# Patient Record
Sex: Female | Born: 1945 | Race: White | Hispanic: No | Smoking: Former smoker
Health system: Southern US, Community
[De-identification: ages and names within clinical notes are randomized; demographics above are authoritative.]

## PROBLEM LIST (undated history)

## (undated) DIAGNOSIS — I10 Essential (primary) hypertension: Secondary | ICD-10-CM

## (undated) DIAGNOSIS — M8580 Other specified disorders of bone density and structure, unspecified site: Secondary | ICD-10-CM

## (undated) DIAGNOSIS — C50919 Malignant neoplasm of unspecified site of unspecified female breast: Secondary | ICD-10-CM

## (undated) DIAGNOSIS — C4490 Unspecified malignant neoplasm of skin, unspecified: Secondary | ICD-10-CM

## (undated) DIAGNOSIS — E785 Hyperlipidemia, unspecified: Secondary | ICD-10-CM

## (undated) DIAGNOSIS — E559 Vitamin D deficiency, unspecified: Secondary | ICD-10-CM

## (undated) DIAGNOSIS — M1990 Unspecified osteoarthritis, unspecified site: Secondary | ICD-10-CM

## (undated) DIAGNOSIS — T7840XA Allergy, unspecified, initial encounter: Secondary | ICD-10-CM

## (undated) HISTORY — DX: Other specified disorders of bone density and structure, unspecified site: M85.80

## (undated) HISTORY — DX: Essential (primary) hypertension: I10

## (undated) HISTORY — DX: Allergy, unspecified, initial encounter: T78.40XA

## (undated) HISTORY — PX: APPENDECTOMY: SHX54

## (undated) HISTORY — DX: Hyperlipidemia, unspecified: E78.5

## (undated) HISTORY — DX: Unspecified osteoarthritis, unspecified site: M19.90

## (undated) HISTORY — DX: Malignant neoplasm of unspecified site of unspecified female breast: C50.919

## (undated) HISTORY — DX: Vitamin D deficiency, unspecified: E55.9

## (undated) HISTORY — DX: Unspecified malignant neoplasm of skin, unspecified: C44.90

---
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
## 2006-07-31 DIAGNOSIS — C4372 Malignant melanoma of left lower limb, including hip: Secondary | ICD-10-CM

## 2006-07-31 HISTORY — PX: MELANOMA EXCISION WITH SENTINEL LYMPH NODE BIOPSY: SHX5267

## 2006-07-31 HISTORY — DX: Malignant melanoma of left lower limb, including hip: C43.72

## 2006-10-12 DIAGNOSIS — C439 Malignant melanoma of skin, unspecified: Secondary | ICD-10-CM | POA: Insufficient documentation

## 2012-07-31 HISTORY — PX: BASAL CELL CARCINOMA EXCISION: SHX1214

## 2012-08-12 DIAGNOSIS — Z1211 Encounter for screening for malignant neoplasm of colon: Secondary | ICD-10-CM | POA: Diagnosis not present

## 2012-08-27 DIAGNOSIS — K219 Gastro-esophageal reflux disease without esophagitis: Secondary | ICD-10-CM | POA: Diagnosis not present

## 2012-08-27 DIAGNOSIS — Z1211 Encounter for screening for malignant neoplasm of colon: Secondary | ICD-10-CM | POA: Diagnosis not present

## 2012-08-27 DIAGNOSIS — Z7982 Long term (current) use of aspirin: Secondary | ICD-10-CM | POA: Diagnosis not present

## 2012-08-27 DIAGNOSIS — I10 Essential (primary) hypertension: Secondary | ICD-10-CM | POA: Diagnosis not present

## 2012-08-27 DIAGNOSIS — Z79899 Other long term (current) drug therapy: Secondary | ICD-10-CM | POA: Diagnosis not present

## 2012-08-27 DIAGNOSIS — E7800 Pure hypercholesterolemia, unspecified: Secondary | ICD-10-CM | POA: Diagnosis not present

## 2012-08-27 DIAGNOSIS — K644 Residual hemorrhoidal skin tags: Secondary | ICD-10-CM | POA: Diagnosis not present

## 2012-08-27 DIAGNOSIS — Z85820 Personal history of malignant melanoma of skin: Secondary | ICD-10-CM | POA: Diagnosis not present

## 2012-10-24 DIAGNOSIS — D485 Neoplasm of uncertain behavior of skin: Secondary | ICD-10-CM | POA: Diagnosis not present

## 2012-10-24 DIAGNOSIS — L570 Actinic keratosis: Secondary | ICD-10-CM | POA: Diagnosis not present

## 2012-10-29 DIAGNOSIS — C4441 Basal cell carcinoma of skin of scalp and neck: Secondary | ICD-10-CM | POA: Diagnosis not present

## 2012-12-16 DIAGNOSIS — Z1239 Encounter for other screening for malignant neoplasm of breast: Secondary | ICD-10-CM | POA: Diagnosis not present

## 2012-12-16 DIAGNOSIS — Z01419 Encounter for gynecological examination (general) (routine) without abnormal findings: Secondary | ICD-10-CM | POA: Diagnosis not present

## 2012-12-20 DIAGNOSIS — Z1231 Encounter for screening mammogram for malignant neoplasm of breast: Secondary | ICD-10-CM | POA: Diagnosis not present

## 2013-01-02 DIAGNOSIS — N6009 Solitary cyst of unspecified breast: Secondary | ICD-10-CM | POA: Diagnosis not present

## 2013-01-02 DIAGNOSIS — R928 Other abnormal and inconclusive findings on diagnostic imaging of breast: Secondary | ICD-10-CM | POA: Diagnosis not present

## 2013-05-06 DIAGNOSIS — L219 Seborrheic dermatitis, unspecified: Secondary | ICD-10-CM | POA: Diagnosis not present

## 2013-05-06 DIAGNOSIS — L821 Other seborrheic keratosis: Secondary | ICD-10-CM | POA: Diagnosis not present

## 2013-05-06 DIAGNOSIS — D2360 Other benign neoplasm of skin of unspecified upper limb, including shoulder: Secondary | ICD-10-CM | POA: Diagnosis not present

## 2014-01-21 DIAGNOSIS — E559 Vitamin D deficiency, unspecified: Secondary | ICD-10-CM | POA: Diagnosis not present

## 2014-01-21 DIAGNOSIS — Z9181 History of falling: Secondary | ICD-10-CM | POA: Diagnosis not present

## 2014-01-21 DIAGNOSIS — E782 Mixed hyperlipidemia: Secondary | ICD-10-CM | POA: Diagnosis not present

## 2014-01-21 DIAGNOSIS — Z1331 Encounter for screening for depression: Secondary | ICD-10-CM | POA: Diagnosis not present

## 2014-01-21 DIAGNOSIS — I10 Essential (primary) hypertension: Secondary | ICD-10-CM | POA: Diagnosis not present

## 2014-01-21 DIAGNOSIS — M899 Disorder of bone, unspecified: Secondary | ICD-10-CM | POA: Diagnosis not present

## 2014-01-21 DIAGNOSIS — M949 Disorder of cartilage, unspecified: Secondary | ICD-10-CM | POA: Diagnosis not present

## 2014-02-24 DIAGNOSIS — Z01419 Encounter for gynecological examination (general) (routine) without abnormal findings: Secondary | ICD-10-CM | POA: Diagnosis not present

## 2014-02-24 DIAGNOSIS — Z1239 Encounter for other screening for malignant neoplasm of breast: Secondary | ICD-10-CM | POA: Diagnosis not present

## 2014-03-12 DIAGNOSIS — Z1231 Encounter for screening mammogram for malignant neoplasm of breast: Secondary | ICD-10-CM | POA: Diagnosis not present

## 2014-06-10 DIAGNOSIS — M6740 Ganglion, unspecified site: Secondary | ICD-10-CM | POA: Diagnosis not present

## 2014-06-10 DIAGNOSIS — M25572 Pain in left ankle and joints of left foot: Secondary | ICD-10-CM | POA: Diagnosis not present

## 2014-06-10 DIAGNOSIS — M25531 Pain in right wrist: Secondary | ICD-10-CM | POA: Diagnosis not present

## 2014-06-10 DIAGNOSIS — M19231 Secondary osteoarthritis, right wrist: Secondary | ICD-10-CM | POA: Diagnosis not present

## 2014-06-10 DIAGNOSIS — M19041 Primary osteoarthritis, right hand: Secondary | ICD-10-CM | POA: Diagnosis not present

## 2014-06-22 DIAGNOSIS — M67431 Ganglion, right wrist: Secondary | ICD-10-CM | POA: Diagnosis not present

## 2014-09-29 DIAGNOSIS — L570 Actinic keratosis: Secondary | ICD-10-CM | POA: Diagnosis not present

## 2014-09-29 DIAGNOSIS — L219 Seborrheic dermatitis, unspecified: Secondary | ICD-10-CM | POA: Diagnosis not present

## 2014-09-29 DIAGNOSIS — D485 Neoplasm of uncertain behavior of skin: Secondary | ICD-10-CM | POA: Diagnosis not present

## 2014-09-29 DIAGNOSIS — C44311 Basal cell carcinoma of skin of nose: Secondary | ICD-10-CM | POA: Diagnosis not present

## 2014-09-29 DIAGNOSIS — L299 Pruritus, unspecified: Secondary | ICD-10-CM | POA: Diagnosis not present

## 2014-10-21 DIAGNOSIS — C44311 Basal cell carcinoma of skin of nose: Secondary | ICD-10-CM | POA: Diagnosis not present

## 2015-02-09 DIAGNOSIS — L728 Other follicular cysts of the skin and subcutaneous tissue: Secondary | ICD-10-CM | POA: Diagnosis not present

## 2015-02-09 DIAGNOSIS — L821 Other seborrheic keratosis: Secondary | ICD-10-CM | POA: Diagnosis not present

## 2015-03-31 DIAGNOSIS — Z01419 Encounter for gynecological examination (general) (routine) without abnormal findings: Secondary | ICD-10-CM | POA: Diagnosis not present

## 2015-04-07 DIAGNOSIS — Z1231 Encounter for screening mammogram for malignant neoplasm of breast: Secondary | ICD-10-CM | POA: Diagnosis not present

## 2015-07-13 DIAGNOSIS — L820 Inflamed seborrheic keratosis: Secondary | ICD-10-CM | POA: Diagnosis not present

## 2016-01-11 DIAGNOSIS — L821 Other seborrheic keratosis: Secondary | ICD-10-CM | POA: Diagnosis not present

## 2016-01-11 DIAGNOSIS — D225 Melanocytic nevi of trunk: Secondary | ICD-10-CM | POA: Diagnosis not present

## 2016-07-10 DIAGNOSIS — Z800 Family history of malignant neoplasm of digestive organs: Secondary | ICD-10-CM | POA: Diagnosis not present

## 2016-07-10 DIAGNOSIS — M8580 Other specified disorders of bone density and structure, unspecified site: Secondary | ICD-10-CM | POA: Diagnosis not present

## 2016-07-10 DIAGNOSIS — E559 Vitamin D deficiency, unspecified: Secondary | ICD-10-CM | POA: Diagnosis not present

## 2016-07-10 DIAGNOSIS — I10 Essential (primary) hypertension: Secondary | ICD-10-CM | POA: Diagnosis not present

## 2016-07-10 DIAGNOSIS — Z79899 Other long term (current) drug therapy: Secondary | ICD-10-CM | POA: Diagnosis not present

## 2016-07-10 DIAGNOSIS — E2839 Other primary ovarian failure: Secondary | ICD-10-CM | POA: Diagnosis not present

## 2016-07-10 DIAGNOSIS — Z23 Encounter for immunization: Secondary | ICD-10-CM | POA: Diagnosis not present

## 2016-07-10 DIAGNOSIS — Z1231 Encounter for screening mammogram for malignant neoplasm of breast: Secondary | ICD-10-CM | POA: Diagnosis not present

## 2016-07-10 DIAGNOSIS — E782 Mixed hyperlipidemia: Secondary | ICD-10-CM | POA: Diagnosis not present

## 2016-07-10 DIAGNOSIS — Z1389 Encounter for screening for other disorder: Secondary | ICD-10-CM | POA: Diagnosis not present

## 2016-07-10 DIAGNOSIS — Z9181 History of falling: Secondary | ICD-10-CM | POA: Diagnosis not present

## 2016-07-10 DIAGNOSIS — Z6828 Body mass index (BMI) 28.0-28.9, adult: Secondary | ICD-10-CM | POA: Diagnosis not present

## 2016-07-13 DIAGNOSIS — L570 Actinic keratosis: Secondary | ICD-10-CM | POA: Diagnosis not present

## 2016-07-27 DIAGNOSIS — Z1231 Encounter for screening mammogram for malignant neoplasm of breast: Secondary | ICD-10-CM | POA: Diagnosis not present

## 2016-07-27 DIAGNOSIS — M8580 Other specified disorders of bone density and structure, unspecified site: Secondary | ICD-10-CM | POA: Insufficient documentation

## 2016-07-27 DIAGNOSIS — M8589 Other specified disorders of bone density and structure, multiple sites: Secondary | ICD-10-CM | POA: Diagnosis not present

## 2016-07-27 DIAGNOSIS — Z780 Asymptomatic menopausal state: Secondary | ICD-10-CM | POA: Insufficient documentation

## 2016-07-27 DIAGNOSIS — M8588 Other specified disorders of bone density and structure, other site: Secondary | ICD-10-CM | POA: Diagnosis not present

## 2016-07-27 DIAGNOSIS — E2839 Other primary ovarian failure: Secondary | ICD-10-CM | POA: Diagnosis not present

## 2016-11-01 DIAGNOSIS — Z9181 History of falling: Secondary | ICD-10-CM | POA: Diagnosis not present

## 2016-11-01 DIAGNOSIS — Z0000 Encounter for general adult medical examination without abnormal findings: Secondary | ICD-10-CM | POA: Diagnosis not present

## 2016-11-01 DIAGNOSIS — Z136 Encounter for screening for cardiovascular disorders: Secondary | ICD-10-CM | POA: Diagnosis not present

## 2016-11-01 DIAGNOSIS — E785 Hyperlipidemia, unspecified: Secondary | ICD-10-CM | POA: Diagnosis not present

## 2016-11-01 DIAGNOSIS — Z1389 Encounter for screening for other disorder: Secondary | ICD-10-CM | POA: Diagnosis not present

## 2016-11-01 DIAGNOSIS — Z6831 Body mass index (BMI) 31.0-31.9, adult: Secondary | ICD-10-CM | POA: Diagnosis not present

## 2017-01-01 DIAGNOSIS — Z6831 Body mass index (BMI) 31.0-31.9, adult: Secondary | ICD-10-CM | POA: Diagnosis not present

## 2017-01-01 DIAGNOSIS — Z79899 Other long term (current) drug therapy: Secondary | ICD-10-CM | POA: Diagnosis not present

## 2017-01-01 DIAGNOSIS — M8580 Other specified disorders of bone density and structure, unspecified site: Secondary | ICD-10-CM | POA: Diagnosis not present

## 2017-01-01 DIAGNOSIS — E785 Hyperlipidemia, unspecified: Secondary | ICD-10-CM | POA: Diagnosis not present

## 2017-01-01 DIAGNOSIS — E559 Vitamin D deficiency, unspecified: Secondary | ICD-10-CM | POA: Diagnosis not present

## 2017-01-01 DIAGNOSIS — I10 Essential (primary) hypertension: Secondary | ICD-10-CM | POA: Diagnosis not present

## 2017-01-01 DIAGNOSIS — E782 Mixed hyperlipidemia: Secondary | ICD-10-CM | POA: Diagnosis not present

## 2017-01-01 DIAGNOSIS — E669 Obesity, unspecified: Secondary | ICD-10-CM | POA: Diagnosis not present

## 2017-01-01 DIAGNOSIS — Z800 Family history of malignant neoplasm of digestive organs: Secondary | ICD-10-CM | POA: Diagnosis not present

## 2017-01-09 DIAGNOSIS — D225 Melanocytic nevi of trunk: Secondary | ICD-10-CM | POA: Diagnosis not present

## 2017-01-09 DIAGNOSIS — L728 Other follicular cysts of the skin and subcutaneous tissue: Secondary | ICD-10-CM | POA: Diagnosis not present

## 2017-01-09 DIAGNOSIS — L820 Inflamed seborrheic keratosis: Secondary | ICD-10-CM | POA: Diagnosis not present

## 2017-06-27 DIAGNOSIS — E559 Vitamin D deficiency, unspecified: Secondary | ICD-10-CM | POA: Diagnosis not present

## 2017-06-27 DIAGNOSIS — Z23 Encounter for immunization: Secondary | ICD-10-CM | POA: Diagnosis not present

## 2017-06-27 DIAGNOSIS — Z800 Family history of malignant neoplasm of digestive organs: Secondary | ICD-10-CM | POA: Diagnosis not present

## 2017-06-27 DIAGNOSIS — M8580 Other specified disorders of bone density and structure, unspecified site: Secondary | ICD-10-CM | POA: Diagnosis not present

## 2017-06-27 DIAGNOSIS — Z79899 Other long term (current) drug therapy: Secondary | ICD-10-CM | POA: Diagnosis not present

## 2017-06-27 DIAGNOSIS — E782 Mixed hyperlipidemia: Secondary | ICD-10-CM | POA: Diagnosis not present

## 2017-06-27 DIAGNOSIS — I10 Essential (primary) hypertension: Secondary | ICD-10-CM | POA: Diagnosis not present

## 2017-06-27 DIAGNOSIS — Z6831 Body mass index (BMI) 31.0-31.9, adult: Secondary | ICD-10-CM | POA: Diagnosis not present

## 2017-09-03 DIAGNOSIS — L0100 Impetigo, unspecified: Secondary | ICD-10-CM | POA: Diagnosis not present

## 2017-11-02 DIAGNOSIS — E785 Hyperlipidemia, unspecified: Secondary | ICD-10-CM | POA: Diagnosis not present

## 2017-11-02 DIAGNOSIS — Z9181 History of falling: Secondary | ICD-10-CM | POA: Diagnosis not present

## 2017-11-02 DIAGNOSIS — Z0000 Encounter for general adult medical examination without abnormal findings: Secondary | ICD-10-CM | POA: Diagnosis not present

## 2017-11-02 DIAGNOSIS — Z1331 Encounter for screening for depression: Secondary | ICD-10-CM | POA: Diagnosis not present

## 2017-11-02 DIAGNOSIS — Z1231 Encounter for screening mammogram for malignant neoplasm of breast: Secondary | ICD-10-CM | POA: Diagnosis not present

## 2017-11-02 DIAGNOSIS — Z23 Encounter for immunization: Secondary | ICD-10-CM | POA: Diagnosis not present

## 2017-11-02 DIAGNOSIS — N959 Unspecified menopausal and perimenopausal disorder: Secondary | ICD-10-CM | POA: Diagnosis not present

## 2017-11-02 DIAGNOSIS — Z6831 Body mass index (BMI) 31.0-31.9, adult: Secondary | ICD-10-CM | POA: Diagnosis not present

## 2017-11-30 DIAGNOSIS — Z1231 Encounter for screening mammogram for malignant neoplasm of breast: Secondary | ICD-10-CM | POA: Diagnosis not present

## 2017-12-12 DIAGNOSIS — R922 Inconclusive mammogram: Secondary | ICD-10-CM | POA: Diagnosis not present

## 2017-12-12 DIAGNOSIS — R928 Other abnormal and inconclusive findings on diagnostic imaging of breast: Secondary | ICD-10-CM | POA: Diagnosis not present

## 2017-12-19 DIAGNOSIS — E782 Mixed hyperlipidemia: Secondary | ICD-10-CM | POA: Diagnosis not present

## 2017-12-19 DIAGNOSIS — M8580 Other specified disorders of bone density and structure, unspecified site: Secondary | ICD-10-CM | POA: Diagnosis not present

## 2017-12-19 DIAGNOSIS — Z79899 Other long term (current) drug therapy: Secondary | ICD-10-CM | POA: Diagnosis not present

## 2017-12-19 DIAGNOSIS — I10 Essential (primary) hypertension: Secondary | ICD-10-CM | POA: Diagnosis not present

## 2017-12-19 DIAGNOSIS — E559 Vitamin D deficiency, unspecified: Secondary | ICD-10-CM | POA: Diagnosis not present

## 2017-12-19 DIAGNOSIS — Z800 Family history of malignant neoplasm of digestive organs: Secondary | ICD-10-CM | POA: Diagnosis not present

## 2017-12-19 DIAGNOSIS — Z23 Encounter for immunization: Secondary | ICD-10-CM | POA: Diagnosis not present

## 2017-12-20 DIAGNOSIS — D1801 Hemangioma of skin and subcutaneous tissue: Secondary | ICD-10-CM | POA: Diagnosis not present

## 2017-12-20 DIAGNOSIS — L820 Inflamed seborrheic keratosis: Secondary | ICD-10-CM | POA: Diagnosis not present

## 2017-12-20 DIAGNOSIS — L918 Other hypertrophic disorders of the skin: Secondary | ICD-10-CM | POA: Diagnosis not present

## 2018-06-25 DIAGNOSIS — Z23 Encounter for immunization: Secondary | ICD-10-CM | POA: Diagnosis not present

## 2018-06-25 DIAGNOSIS — I10 Essential (primary) hypertension: Secondary | ICD-10-CM | POA: Diagnosis not present

## 2018-06-25 DIAGNOSIS — M8580 Other specified disorders of bone density and structure, unspecified site: Secondary | ICD-10-CM | POA: Diagnosis not present

## 2018-06-25 DIAGNOSIS — E782 Mixed hyperlipidemia: Secondary | ICD-10-CM | POA: Diagnosis not present

## 2018-06-25 DIAGNOSIS — E559 Vitamin D deficiency, unspecified: Secondary | ICD-10-CM | POA: Diagnosis not present

## 2018-11-05 DIAGNOSIS — E785 Hyperlipidemia, unspecified: Secondary | ICD-10-CM | POA: Diagnosis not present

## 2018-11-05 DIAGNOSIS — Z0000 Encounter for general adult medical examination without abnormal findings: Secondary | ICD-10-CM | POA: Diagnosis not present

## 2018-11-05 DIAGNOSIS — Z9181 History of falling: Secondary | ICD-10-CM | POA: Diagnosis not present

## 2018-11-05 DIAGNOSIS — Z1331 Encounter for screening for depression: Secondary | ICD-10-CM | POA: Diagnosis not present

## 2018-12-26 DIAGNOSIS — I10 Essential (primary) hypertension: Secondary | ICD-10-CM | POA: Diagnosis not present

## 2018-12-26 DIAGNOSIS — M8580 Other specified disorders of bone density and structure, unspecified site: Secondary | ICD-10-CM | POA: Diagnosis not present

## 2018-12-26 DIAGNOSIS — E559 Vitamin D deficiency, unspecified: Secondary | ICD-10-CM | POA: Diagnosis not present

## 2018-12-26 DIAGNOSIS — E782 Mixed hyperlipidemia: Secondary | ICD-10-CM | POA: Diagnosis not present

## 2019-02-27 DIAGNOSIS — Z1231 Encounter for screening mammogram for malignant neoplasm of breast: Secondary | ICD-10-CM | POA: Diagnosis not present

## 2019-02-27 DIAGNOSIS — R928 Other abnormal and inconclusive findings on diagnostic imaging of breast: Secondary | ICD-10-CM | POA: Diagnosis not present

## 2019-03-24 DIAGNOSIS — N6321 Unspecified lump in the left breast, upper outer quadrant: Secondary | ICD-10-CM | POA: Diagnosis not present

## 2019-03-24 DIAGNOSIS — N6002 Solitary cyst of left breast: Secondary | ICD-10-CM | POA: Diagnosis not present

## 2019-03-24 DIAGNOSIS — R922 Inconclusive mammogram: Secondary | ICD-10-CM | POA: Diagnosis not present

## 2019-04-30 DIAGNOSIS — M8580 Other specified disorders of bone density and structure, unspecified site: Secondary | ICD-10-CM | POA: Diagnosis not present

## 2019-04-30 DIAGNOSIS — Z23 Encounter for immunization: Secondary | ICD-10-CM | POA: Diagnosis not present

## 2019-04-30 DIAGNOSIS — E559 Vitamin D deficiency, unspecified: Secondary | ICD-10-CM | POA: Diagnosis not present

## 2019-04-30 DIAGNOSIS — E782 Mixed hyperlipidemia: Secondary | ICD-10-CM | POA: Diagnosis not present

## 2019-04-30 DIAGNOSIS — Z139 Encounter for screening, unspecified: Secondary | ICD-10-CM | POA: Diagnosis not present

## 2019-04-30 DIAGNOSIS — I10 Essential (primary) hypertension: Secondary | ICD-10-CM | POA: Diagnosis not present

## 2019-09-08 DIAGNOSIS — M8580 Other specified disorders of bone density and structure, unspecified site: Secondary | ICD-10-CM | POA: Diagnosis not present

## 2019-09-08 DIAGNOSIS — E559 Vitamin D deficiency, unspecified: Secondary | ICD-10-CM | POA: Diagnosis not present

## 2019-09-08 DIAGNOSIS — I10 Essential (primary) hypertension: Secondary | ICD-10-CM | POA: Diagnosis not present

## 2019-09-08 DIAGNOSIS — E782 Mixed hyperlipidemia: Secondary | ICD-10-CM | POA: Diagnosis not present

## 2019-10-21 DIAGNOSIS — L820 Inflamed seborrheic keratosis: Secondary | ICD-10-CM | POA: Diagnosis not present

## 2019-10-21 DIAGNOSIS — L219 Seborrheic dermatitis, unspecified: Secondary | ICD-10-CM | POA: Diagnosis not present

## 2019-10-21 DIAGNOSIS — L821 Other seborrheic keratosis: Secondary | ICD-10-CM | POA: Diagnosis not present

## 2019-10-21 DIAGNOSIS — D1801 Hemangioma of skin and subcutaneous tissue: Secondary | ICD-10-CM | POA: Diagnosis not present

## 2019-10-21 DIAGNOSIS — Z85820 Personal history of malignant melanoma of skin: Secondary | ICD-10-CM | POA: Diagnosis not present

## 2019-11-11 DIAGNOSIS — Z6831 Body mass index (BMI) 31.0-31.9, adult: Secondary | ICD-10-CM | POA: Diagnosis not present

## 2019-11-11 DIAGNOSIS — E785 Hyperlipidemia, unspecified: Secondary | ICD-10-CM | POA: Diagnosis not present

## 2019-11-11 DIAGNOSIS — Z9181 History of falling: Secondary | ICD-10-CM | POA: Diagnosis not present

## 2019-11-11 DIAGNOSIS — Z0000 Encounter for general adult medical examination without abnormal findings: Secondary | ICD-10-CM | POA: Diagnosis not present

## 2019-11-11 DIAGNOSIS — Z1331 Encounter for screening for depression: Secondary | ICD-10-CM | POA: Diagnosis not present

## 2020-01-07 DIAGNOSIS — M8580 Other specified disorders of bone density and structure, unspecified site: Secondary | ICD-10-CM | POA: Diagnosis not present

## 2020-01-07 DIAGNOSIS — E559 Vitamin D deficiency, unspecified: Secondary | ICD-10-CM | POA: Diagnosis not present

## 2020-01-07 DIAGNOSIS — E782 Mixed hyperlipidemia: Secondary | ICD-10-CM | POA: Diagnosis not present

## 2020-01-07 DIAGNOSIS — E669 Obesity, unspecified: Secondary | ICD-10-CM | POA: Diagnosis not present

## 2020-01-07 DIAGNOSIS — I10 Essential (primary) hypertension: Secondary | ICD-10-CM | POA: Diagnosis not present

## 2020-03-26 DIAGNOSIS — R928 Other abnormal and inconclusive findings on diagnostic imaging of breast: Secondary | ICD-10-CM | POA: Diagnosis not present

## 2020-03-26 DIAGNOSIS — Z1231 Encounter for screening mammogram for malignant neoplasm of breast: Secondary | ICD-10-CM | POA: Diagnosis not present

## 2020-04-21 DIAGNOSIS — R928 Other abnormal and inconclusive findings on diagnostic imaging of breast: Secondary | ICD-10-CM | POA: Diagnosis not present

## 2020-04-21 DIAGNOSIS — N6321 Unspecified lump in the left breast, upper outer quadrant: Secondary | ICD-10-CM | POA: Diagnosis not present

## 2020-04-21 DIAGNOSIS — N6489 Other specified disorders of breast: Secondary | ICD-10-CM | POA: Diagnosis not present

## 2020-04-26 DIAGNOSIS — N6322 Unspecified lump in the left breast, upper inner quadrant: Secondary | ICD-10-CM | POA: Diagnosis not present

## 2020-04-26 DIAGNOSIS — R928 Other abnormal and inconclusive findings on diagnostic imaging of breast: Secondary | ICD-10-CM | POA: Diagnosis not present

## 2020-04-26 DIAGNOSIS — D242 Benign neoplasm of left breast: Secondary | ICD-10-CM | POA: Diagnosis not present

## 2020-04-28 ENCOUNTER — Other Ambulatory Visit: Payer: Self-pay | Admitting: Internal Medicine

## 2020-04-28 DIAGNOSIS — N6320 Unspecified lump in the left breast, unspecified quadrant: Secondary | ICD-10-CM

## 2020-05-05 DIAGNOSIS — I10 Essential (primary) hypertension: Secondary | ICD-10-CM | POA: Diagnosis not present

## 2020-05-05 DIAGNOSIS — E559 Vitamin D deficiency, unspecified: Secondary | ICD-10-CM | POA: Diagnosis not present

## 2020-05-05 DIAGNOSIS — E669 Obesity, unspecified: Secondary | ICD-10-CM | POA: Diagnosis not present

## 2020-05-05 DIAGNOSIS — Z6831 Body mass index (BMI) 31.0-31.9, adult: Secondary | ICD-10-CM | POA: Diagnosis not present

## 2020-05-05 DIAGNOSIS — E782 Mixed hyperlipidemia: Secondary | ICD-10-CM | POA: Diagnosis not present

## 2020-05-05 DIAGNOSIS — M8580 Other specified disorders of bone density and structure, unspecified site: Secondary | ICD-10-CM | POA: Diagnosis not present

## 2020-05-05 DIAGNOSIS — Z139 Encounter for screening, unspecified: Secondary | ICD-10-CM | POA: Diagnosis not present

## 2020-05-05 DIAGNOSIS — Z79899 Other long term (current) drug therapy: Secondary | ICD-10-CM | POA: Diagnosis not present

## 2020-05-10 ENCOUNTER — Ambulatory Visit
Admission: RE | Admit: 2020-05-10 | Discharge: 2020-05-10 | Disposition: A | Discharge status: Home / Self Care | Payer: Medicare Other | Source: Ambulatory Visit | Attending: Internal Medicine | Admitting: Internal Medicine

## 2020-05-10 ENCOUNTER — Other Ambulatory Visit: Payer: Self-pay

## 2020-05-10 DIAGNOSIS — C50312 Malignant neoplasm of lower-inner quadrant of left female breast: Secondary | ICD-10-CM | POA: Diagnosis not present

## 2020-05-10 DIAGNOSIS — N6324 Unspecified lump in the left breast, lower inner quadrant: Secondary | ICD-10-CM | POA: Diagnosis not present

## 2020-05-10 DIAGNOSIS — Z170 Estrogen receptor positive status [ER+]: Secondary | ICD-10-CM | POA: Insufficient documentation

## 2020-05-10 DIAGNOSIS — C50212 Malignant neoplasm of upper-inner quadrant of left female breast: Secondary | ICD-10-CM | POA: Insufficient documentation

## 2020-05-10 DIAGNOSIS — N6320 Unspecified lump in the left breast, unspecified quadrant: Secondary | ICD-10-CM

## 2020-05-10 DIAGNOSIS — C50912 Malignant neoplasm of unspecified site of left female breast: Secondary | ICD-10-CM | POA: Insufficient documentation

## 2020-05-12 DIAGNOSIS — C50312 Malignant neoplasm of lower-inner quadrant of left female breast: Secondary | ICD-10-CM | POA: Diagnosis not present

## 2020-05-13 DIAGNOSIS — Z20822 Contact with and (suspected) exposure to covid-19: Secondary | ICD-10-CM | POA: Diagnosis not present

## 2020-05-13 DIAGNOSIS — Z1152 Encounter for screening for COVID-19: Secondary | ICD-10-CM | POA: Diagnosis not present

## 2020-05-17 DIAGNOSIS — C50312 Malignant neoplasm of lower-inner quadrant of left female breast: Secondary | ICD-10-CM | POA: Diagnosis not present

## 2020-05-17 DIAGNOSIS — C50912 Malignant neoplasm of unspecified site of left female breast: Secondary | ICD-10-CM | POA: Diagnosis not present

## 2020-05-17 DIAGNOSIS — I10 Essential (primary) hypertension: Secondary | ICD-10-CM | POA: Diagnosis not present

## 2020-05-17 DIAGNOSIS — Z85820 Personal history of malignant melanoma of skin: Secondary | ICD-10-CM | POA: Diagnosis not present

## 2020-05-17 DIAGNOSIS — Z87891 Personal history of nicotine dependence: Secondary | ICD-10-CM | POA: Diagnosis not present

## 2020-05-17 DIAGNOSIS — Z79899 Other long term (current) drug therapy: Secondary | ICD-10-CM | POA: Diagnosis not present

## 2020-05-17 DIAGNOSIS — E7800 Pure hypercholesterolemia, unspecified: Secondary | ICD-10-CM | POA: Diagnosis not present

## 2020-05-17 DIAGNOSIS — M8580 Other specified disorders of bone density and structure, unspecified site: Secondary | ICD-10-CM | POA: Diagnosis not present

## 2020-05-17 DIAGNOSIS — Z7982 Long term (current) use of aspirin: Secondary | ICD-10-CM | POA: Diagnosis not present

## 2020-05-17 HISTORY — PX: BREAST LUMPECTOMY WITH AXILLARY LYMPH NODE BIOPSY: SHX5593

## 2020-05-21 DIAGNOSIS — Z23 Encounter for immunization: Secondary | ICD-10-CM | POA: Diagnosis not present

## 2020-05-25 DIAGNOSIS — C50312 Malignant neoplasm of lower-inner quadrant of left female breast: Secondary | ICD-10-CM | POA: Diagnosis not present

## 2020-06-02 ENCOUNTER — Other Ambulatory Visit: Payer: Self-pay

## 2020-06-02 ENCOUNTER — Encounter: Payer: Self-pay | Admitting: Oncology

## 2020-06-02 ENCOUNTER — Inpatient Hospital Stay: Payer: Medicare Other | Attending: Oncology | Admitting: Oncology

## 2020-06-02 ENCOUNTER — Telehealth: Payer: Self-pay | Admitting: Oncology

## 2020-06-02 VITALS — BP 146/69 | HR 82 | Temp 98.5°F | Resp 18 | Ht 60.0 in | Wt 166.5 lb

## 2020-06-02 DIAGNOSIS — M8580 Other specified disorders of bone density and structure, unspecified site: Secondary | ICD-10-CM

## 2020-06-02 DIAGNOSIS — M8588 Other specified disorders of bone density and structure, other site: Secondary | ICD-10-CM | POA: Diagnosis not present

## 2020-06-02 DIAGNOSIS — Z170 Estrogen receptor positive status [ER+]: Secondary | ICD-10-CM | POA: Diagnosis not present

## 2020-06-02 DIAGNOSIS — C50312 Malignant neoplasm of lower-inner quadrant of left female breast: Secondary | ICD-10-CM | POA: Diagnosis not present

## 2020-06-02 DIAGNOSIS — C439 Malignant melanoma of skin, unspecified: Secondary | ICD-10-CM

## 2020-06-02 DIAGNOSIS — C50212 Malignant neoplasm of upper-inner quadrant of left female breast: Secondary | ICD-10-CM

## 2020-06-02 DIAGNOSIS — Z780 Asymptomatic menopausal state: Secondary | ICD-10-CM | POA: Diagnosis not present

## 2020-06-02 NOTE — Progress Notes (Signed)
[facility]  [address] [place],  [place]  [address] [phone]  Clinic Day:  06/02/2020  Referring physician: No ref. provider found   This document serves as a record of services personally performed by Hosie Poisson, MD. It was created on their behalf by Curry,Lauren E, a trained medical scribe. The creation of this record is based on the scribe's personal observations and the provider's statements to them.   CHIEF COMPLAINT:  CC: Stage IA left breast cancer  Current Treatment:  Will plan for hormonal therapy for a total of 5 years   HISTORY OF PRESENT ILLNESS:  Ashley Larson is a 74 y.o. female referred by Dr. Orrin Brigham for the evaluation and treatment of breast cancer.  This was found on routine screening mammogram from August 27th which revealed an abnormality within the left breast.  Diagnostic imaging confirmed a lobulated hypoechoic mass in the left breast at 10 oclock 2 cm from the nipple measuring 5.0 x 4.0 x 5.0 mm.  She first underwent ultrasound guided biopsy of a mass that was presumed to correspond to the mammographic finding and the results were benign, but post biopsy images showed that these did not represent the same lesion.  Therefore, stereotactic biopsy was pursued and surgical pathology from this procedure revealed invasive ductal carcinoma and ductal carcinoma in situ.  HER2 was negative.  Estrogen receptor was positive at 100% and progesterone receptors were positive at 95%.  Ki67 was 10%.  She was referred to Dr. Lilia Pro and on October 18th lumpectomy and left axillary sentinel lymph node biopsy was pursued.  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  All margins were clear of neoplasm and one lymph node was negative for malignancy.  Residual tumor size was 0.4 cm.   INTERVAL HISTORY:  She states that she is healing well  from her procedure and rates her pain as a 2/10 today.  She met with Dr. Orlene Erm earlier, who feels that adjuvant radiation can be defered due to the small size of her malignancy and her age over 56.  She has severe rash of her left upper chest and upper extremity, which still looks significant.  She has been using generic Claritin and topical cream.  She started menarche at age 30, and went through menopause around age 11.  She was on hormone replacement for 2-3 years.  She was 27 when she gave birth to her first child.  She has a history of melanoma of the left foot and underwent excision with sentinel lymph node biopsy several years ago in [place].  Her nodes were clear, but she required a skin graft.  She follows with Dr. Jimmye Norman of dermatology routinely.  Her last colonoscopy was in 2013 which was clear, and she will be due again in 2023.  She undergoes routine lab work at Dr. Hale Bogus office, last checked in early October.  Her  appetite is good, and she is eating well.  She denies any significant weight loss or gain.  She denies fever, chills or other signs of infection.  She denies nausea, vomiting, bowel issues, or abdominal pain.  She denies sore throat, cough, dyspnea, or chest pain.   REVIEW OF SYSTEMS:  Review of Systems  Musculoskeletal:       Mild pain of the left breast  All other systems reviewed and are negative.    VITALS:  Blood pressure Marland Kitchen)  146/69, pulse 82, temperature 98.5 F (36.9 C), temperature source Oral, resp. rate 18, height 5' (1.524 m), weight 166 lb 8 oz (75.5 kg), last menstrual period 07/31/2000.  Wt Readings from Last 3 Encounters:  06/02/20 166 lb 8 oz (75.5 kg)    Body mass index is 32.52 kg/m.  Performance status (ECOG): 1 - Symptomatic but completely ambulatory  PHYSICAL EXAM:  Physical Exam Constitutional:      General: She is not in acute distress.    Appearance: Normal appearance. She is normal weight.  HENT:     Head: Normocephalic and  atraumatic.  Eyes:     General: No scleral icterus.    Extraocular Movements: Extraocular movements intact.     Conjunctiva/sclera: Conjunctivae normal.     Pupils: Pupils are equal, round, and reactive to light.  Cardiovascular:     Rate and Rhythm: Normal rate and regular rhythm.     Pulses: Normal pulses.     Heart sounds: Normal heart sounds. No murmur heard.  No friction rub. No gallop.   Pulmonary:     Effort: Pulmonary effort is normal. No respiratory distress.     Breath sounds: Normal breath sounds.  Chest:     Breasts:        Right: Normal.      Comments: She has a healing incision in the lower inner quadrant of the left breast, and another one in the left axilla which is healing well. Abdominal:     General: Bowel sounds are normal. There is no distension.     Palpations: Abdomen is soft. There is no mass.     Tenderness: There is no abdominal tenderness.  Musculoskeletal:        General: Normal range of motion.     Cervical back: Normal range of motion and neck supple.     Right lower leg: No edema.     Left lower leg: No edema.  Lymphadenopathy:     Cervical: No cervical adenopathy.  Skin:    General: Skin is warm and dry.     Findings: Rash (maculopapular rash with some excoriation of the left upper extremity, left chest wall, shoulder and left upper abdomen.) present.  Neurological:     General: No focal deficit present.     Mental Status: She is alert and oriented to person, place, and time. Mental status is at baseline.  Psychiatric:        Mood and Affect: Mood normal.        Behavior: Behavior normal.        Thought Content: Thought content normal.        Judgment: Judgment normal.     LABS:  No flowsheet data found. No flowsheet data found.  STUDIES:   She underwent bone density on 07/27/2020 showing improved osteopenia with a T-score of -1.8 of the AP spine, previously -2.8.  Left femur neck measures -1.7, previously -1.8.  She underwent a  digital screening bilateral mammogram with tomography on 03/26/2020 showing: breast density category B.  Further evaluation is suggested for a possible mass in the left breast.  Digital diagnostic left mammogram and left breast ultrasound from 04/21/2020 showed: breast density category B.  There is a lobulated hypoechoic mass in the left breast at 10 oclock 2 cm from the nipple measuring 5.0 x 4.0 x 5.0 mm.  She underwent ultrasound guided left breast needle core biopsy on 04/26/2020 and surgical pathology from this procedure revealed: 1.  Breast, left, needle core  biopsy, 10 oclock, 2 cmfn, upper inner quadrant:  -  Sclerotic fibroadenoma  -  No atypia, in situ or invasive malignancy identified.  She underwent a diagnostic left mammogram post ultrasound biopsy on 04/26/2020 showing: 1.  Appropriate positioning of the ribbon shaped tissue marking clip at the site of the biopsited mass in the upper inner quadrant of the left breast. 2.  However, the biopsied mass is NOT the sonographic correlate for the screening detected mass in the inner left breast.  Therefore, stereotactic tomosynthesis core needle biopsy of the screening detected mass is recommended.  MM CLIP PLACEMENT LEFT  Result Date: 05/10/2020 CLINICAL DATA:  Status post stereotactic guided core needle biopsy of a 7 mm mass in the lower inner quadrant of the left breast. EXAM: DIAGNOSTIC LEFT MAMMOGRAM POST STEREOTACTIC BIOPSY COMPARISON:  Previous exam(s). FINDINGS: Mammographic images were obtained following 3D stereotactic guided biopsy of the recently demonstrated 7 mm mass in the lower inner quadrant of the left breast with no ultrasound correlate. The biopsy marking clip is in expected position at the site of biopsy. IMPRESSION: Appropriate positioning of the X shaped biopsy marking clip at the site of biopsy in the lower inner quadrant of the left breast. Final Assessment: Post Procedure Mammograms for Marker Placement  Electronically Signed   By: Claudie Revering M.D.   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 This was found to be concordant by Dr. Claudie Revering. Pathology results were discussed with the patient by telephone. The patient reported doing well after the biopsy with tenderness at the site. Post biopsy instructions and care were reviewed and questions were answered. The patient was encouraged to call [facility] for any additional concerns. Per patient request, surgical consultation has been arranged with Dr. Orrin Brigham at [facility]. Reports and images faxed on May 11, 2020. Pathology results reported by Stacie Acres RN on 05/11/2020. Electronically Signed   By: Claudie Revering M.D.   On: 05/11/2020 14:28   Result Date: 05/11/2020 CLINICAL DATA:  7 mm mass in the lower inner quadrant of the left breast at recent mammography with no ultrasound correlate. EXAM: LEFT BREAST STEREOTACTIC CORE NEEDLE BIOPSY COMPARISON:  Previous exams. FINDINGS: The patient and I discussed the procedure of stereotactic-guided biopsy including benefits and alternatives. We discussed the high likelihood of a successful procedure. We discussed the risks of the procedure including infection, bleeding, tissue injury, clip migration, and inadequate sampling. Informed written consent was given. The usual time out protocol was performed immediately prior to the procedure. Using sterile technique and 1% Lidocaine as local anesthetic, under stereotactic guidance, a 9 gauge vacuum assisted device was used to perform core needle biopsy of the recently demonstrated 7 mm mass in the lower inner quadrant of the left breast using a medial approach. Lesion  quadrant: Lower inner quadrant At the conclusion of the procedure, an X shaped tissue marker clip was deployed into the biopsy cavity. Follow-up 2-view mammogram was performed and dictated separately. IMPRESSION: Stereotactic-guided biopsy of the recently demonstrated 7 mm mass in the lower inner quadrant of the left breast. No apparent complications. Electronically Signed: By: Claudie Revering M.D. On: 05/10/2020 11:01   She underwent stereotactic needle core biopsy on 05/10/2020 and  surgical pathology from this procedure revealed: 1.  Breast, left, needle core biopsy, lower inner quadrant:  -  Invasive ductal carcinoma, grade 1-2  -  Ductal carcinoma in situ HER2: Negative (0) ER: 100% Positive PR: 95% Positive Ki67: 10%   She underwent a left breast lumpectomy and left axillary lymph node biopsy on 05/17/2020 and final pathology from this procedure confirmed: 1.  Lymph node, biopsy, left axillary sentinel:  -  Only lymph node negative for malignancy (0/1) 2.  Breast, biopsy, left mass:  -  Invasive ductal carcinoma, grade 1, and ductal carcinoma in situ, adjacent to biopsy site  -  Biopsy site reaction and clip present  -  All margins negative for invasive carcinoma and DCIS Tumor size: 4 mm Histologic grade: 1   HISTORY:   Past Medical History:  Diagnosis Date   Allergy    Arthritis    Breast cancer ([facility])    Hyperlipidemia    Hypertension    Melanoma of foot, left ([facility]) 2008   treated with excision   Osteopenia    Skin cancer    History of basal cell carcinoma   Vitamin D deficiency     Past Surgical History:  Procedure Laterality Date   APPENDECTOMY     BASAL CELL CARCINOMA EXCISION  2014   SCALP   BREAST LUMPECTOMY WITH AXILLARY LYMPH NODE BIOPSY Left 05/17/2020   MELANOMA EXCISION WITH SENTINEL LYMPH NODE BIOPSY Left 2008   FOOT   TONSILLECTOMY  1954    Family History  Problem Relation Age of Onset   Prostate cancer Father        in his 34s    Pancreatic cancer Maternal Aunt        in her 69s    Social History:  reports that she quit smoking about 59 years ago. Her smoking use included cigarettes. She started smoking about 61 years ago. She has a 1.00 pack-year smoking history. She has never used smokeless tobacco. She reports current alcohol use of about 1.0 standard drink of alcohol per week. She reports that she does not use drugs.The patient is alone today.  She is widowed and lives at home alone.  She has 2 children.  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    Allergies:  Allergies  Allergen Reactions   Wound Dressing Adhesive     Current Medications: Current Outpatient Medications  Medication Sig Dispense Refill   alendronate (FOSAMAX) 70 MG tablet Take 70 mg by mouth once a week.     cholecalciferol (VITAMIN D3) 25 MCG (1000 UNIT) tablet Take 1,000 Units by mouth daily.     ezetimibe (ZETIA) 10 MG tablet Take 10 mg by mouth daily.     lisinopril-hydrochlorothiazide (ZESTORETIC) 20-12.5 MG tablet Take 1 tablet by mouth daily.     simvastatin (ZOCOR) 40 MG tablet Take 40 mg by mouth daily.     No current facility-administered medications for this visit.     ASSESSMENT & PLAN:   Assessment:   1.  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  This was treated with lumpectomy and sentinel lymph node was negative.  HER2 was negative and estrogen and progesterone receptors were highly positive.  She has met with Dr. Orlene Erm who has deferred adjuvant radiation due to the small size of her malignancy, prognostic indicators and age.  I do recommend adjuvant hormonal therapy  and we discussed this today.  2.  History of melanoma of the left foot, diagnosed in 2008.  This was treated with wide excision in [place] and sentinel lymph node was negative.  She remains without evidence of recurrence.  She followed  with Dr. Jimmye Norman of dermatology routinely.  3.  History of basal cell carcinoma of the nose, treated with excision.  As above, she follows with Dr. Jimmye Norman routinely.  4.  Osteopenia.  She is currently on Fosamax 70 mg weekly.  Her last bone density scan was in December 2017, so I will schedule this for her.  5.  Vitamin D deficiency.  Plan: This is a 74 year old female recently diagnosed with stage IA invasive ductal carcinoma of the left breast, treated with lumpectomy.  She has met with Dr. Orlene Erm who has deferred adjuvant radiation due to her prognostic indicators and small size of the malignancy.  As her hormone receptors are highly positive, hormonal therapy is indicated for at least a total of 5 years, and I recommend anastrozole.  We discussed the benefits and potential toxicities of this therapy.  She is in agreement, but wishes to wait until she returns from her cruise to start, so we will plan for December.  We will plan to see her back in 3 months for repeat examination.  As she is past due for bone density I will schedule this for her, and we will repeat this every 2 years while she is on therapy.  I discussed the assessment and treatment plan with the patient.  The patient was provided an opportunity to ask questions and all were answered.  The patient agreed with the plan and demonstrated an understanding of the instructions.  The patient was advised to call back if the symptoms worsen or if the condition fails to improve as anticipated.  Thank you for the opportunity    I provided 55 minutes of face-to-face time during this this encounter and > 50% was spent counseling as documented under my assessment and plan.    Derwood Kaplan, MD [facility] AT [facility] [address] [place] [address] Dept: [phone] Dept Fax: [phone]   I, Rita Ohara, am acting as scribe for Derwood Kaplan, MD  I have  reviewed this report as typed by the medical scribe, and it is complete and accurate.

## 2020-06-02 NOTE — Telephone Encounter (Signed)
Per 11/3 Los.Appt given to patient

## 2020-06-13 ENCOUNTER — Other Ambulatory Visit: Payer: Self-pay | Admitting: Oncology

## 2020-06-13 DIAGNOSIS — Z170 Estrogen receptor positive status [ER+]: Secondary | ICD-10-CM

## 2020-06-13 DIAGNOSIS — C50212 Malignant neoplasm of upper-inner quadrant of left female breast: Secondary | ICD-10-CM

## 2020-06-13 MED ORDER — ANASTROZOLE 1 MG PO TABS: 1.0000 mg | ORAL_TABLET | Freq: Every day | ORAL | 5 refills | Status: DC

## 2020-06-15 DIAGNOSIS — Z23 Encounter for immunization: Secondary | ICD-10-CM | POA: Diagnosis not present

## 2020-06-30 DIAGNOSIS — Z780 Asymptomatic menopausal state: Secondary | ICD-10-CM | POA: Diagnosis not present

## 2020-06-30 DIAGNOSIS — M810 Age-related osteoporosis without current pathological fracture: Secondary | ICD-10-CM | POA: Diagnosis not present

## 2020-06-30 DIAGNOSIS — M8589 Other specified disorders of bone density and structure, multiple sites: Secondary | ICD-10-CM | POA: Diagnosis not present

## 2020-07-06 DIAGNOSIS — L918 Other hypertrophic disorders of the skin: Secondary | ICD-10-CM | POA: Diagnosis not present

## 2020-07-06 DIAGNOSIS — D1801 Hemangioma of skin and subcutaneous tissue: Secondary | ICD-10-CM | POA: Diagnosis not present

## 2020-07-06 DIAGNOSIS — D485 Neoplasm of uncertain behavior of skin: Secondary | ICD-10-CM | POA: Diagnosis not present

## 2020-07-07 DIAGNOSIS — C50312 Malignant neoplasm of lower-inner quadrant of left female breast: Secondary | ICD-10-CM | POA: Diagnosis not present

## 2020-09-01 NOTE — Progress Notes (Incomplete)
[name]  [address] [place],  [place]  [address] [phone]  Clinic Day:  09/01/2020  Referring physician: Helen Larson., MD   This document serves as a record of services personally performed by Ashley Poisson, MD. It was created on their behalf by [facility] E, a trained medical scribe. The creation of this record is based on the scribe's personal observations and the provider's statements to them.   CHIEF COMPLAINT:  CC: Stage IA left breast cancer  Current Treatment:  Hormonal therapy with anastrozole 1 mg daily for a total of 5 years   HISTORY OF PRESENT ILLNESS:  Ashley Larson is a 75 y.o. female referred by Dr. Orrin Larson for the evaluation and treatment of breast cancer.  This was found on routine screening mammogram from August 27th which revealed an abnormality within the left breast.  Diagnostic imaging confirmed a lobulated hypoechoic mass in the left breast at 10 o'clock 2 cm from the nipple measuring 5.0 x 4.0 x 5.0 mm.  She first underwent ultrasound guided biopsy of a mass that was presumed to correspond to the mammographic finding and the results were benign, but post biopsy images showed that these did not represent the same lesion.  Therefore, stereotactic biopsy was pursued and surgical pathology from this procedure revealed invasive ductal carcinoma and ductal carcinoma in situ.  HER2 was negative.  Estrogen receptor was positive at 100% and progesterone receptors were positive at 95%.  Ki67 was 10%.  She was referred to Dr. Lilia Larson and on October 18th lumpectomy and left axillary sentinel lymph node biopsy was pursued.  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  All margins were clear of neoplasm and one lymph node was negative for malignancy.  Residual tumor size was 0.4 cm.   INTERVAL HISTORY:  She states that she is  healing well from her procedure and rates her pain as a 2/10 today.  She met with Dr. Orlene Larson earlier, who feels that adjuvant radiation can be defered due to the small size of her malignancy and her age over 34.  She has severe rash of her left upper chest and upper extremity, which still looks significant.  She has been using generic Claritin and topical cream.  She started menarche at age 77, and went through menopause around age 65.  She was on hormone replacement for 2-3 years.  She was 27 when she gave birth to her first child.  She has a history of melanoma of the left foot and underwent excision with sentinel lymph node biopsy several years ago in [place].  Her nodes were clear, but she required a skin graft.  She follows with Dr. Jimmye Larson of dermatology routinely.  Her last colonoscopy was in 2013 which was clear, and she will be due again in 2023.  She undergoes routine lab work at Dr. Hale Bogus office, last checked in early October.  Her  appetite is good, and she is eating well.  She denies any significant weight loss or gain.  She denies fever, chills or other signs of infection.  She denies nausea, vomiting, bowel issues, or abdominal pain.  She denies sore throat, cough, dyspnea, or chest pain.  She is here for routine follow up ***.  She started hormonal therapy with anastrozole in December and has tolerated this without significant difficulty.   Her  appetite is good, and she has gained/lost  _ pounds since her last visit.  She denies fever, chills or other signs of infection.  She denies nausea, vomiting, bowel issues, or abdominal pain.  She denies sore throat, cough, dyspnea, or chest pain.  REVIEW OF SYSTEMS:  Review of Systems - Oncology   VITALS:  Last menstrual period 07/31/2000.  Wt Readings from Last 3 Encounters:  06/02/20 166 lb 8 oz (75.5 kg)    There is no height or weight on file to calculate BMI.  Performance status (ECOG): 1 - Symptomatic but completely  ambulatory  PHYSICAL EXAM:  Physical Exam  LABS:  No flowsheet data found. No flowsheet data found.  STUDIES:   No current studies   HISTORY:   Allergies:  Allergies  Allergen Reactions  . Wound Dressing Adhesive     Current Medications: Current Outpatient Medications  Medication Sig Dispense Refill  . alendronate (FOSAMAX) 70 MG tablet Take 70 mg by mouth once a week.    Marland Kitchen anastrozole (ARIMIDEX) 1 MG tablet Take 1 tablet (1 mg total) by mouth daily. 30 tablet 5  . cholecalciferol (VITAMIN D3) 25 MCG (1000 UNIT) tablet Take 1,000 Units by mouth daily.    Marland Kitchen ezetimibe (ZETIA) 10 MG tablet Take 10 mg by mouth daily.    Marland Kitchen lisinopril-hydrochlorothiazide (ZESTORETIC) 20-12.5 MG tablet Take 1 tablet by mouth daily.    . simvastatin (ZOCOR) 40 MG tablet Take 40 mg by mouth daily.     No current facility-administered medications for this visit.     ASSESSMENT & PLAN:   Assessment:   1.  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  This was treated with lumpectomy and sentinel lymph node was negative.  HER2 was negative and estrogen and progesterone receptors were highly positive.  She has met with Dr. Orlene Larson who has deferred adjuvant radiation due to the small size of her malignancy, prognostic indicators and age.  I do recommend adjuvant hormonal therapy and we discussed this today.  2.  History of melanoma of the left foot, diagnosed in 2008.  This was treated with wide excision in [place] and sentinel lymph node was negative.  She remains without evidence of recurrence.  She followed with Dr. Jimmye Larson of dermatology routinely.  3.  History of basal cell carcinoma of the nose, treated with excision.  As above, she follows with Dr. Jimmye Larson routinely.  4.  Osteopenia.  She is currently on Fosamax 70 mg weekly.  Her last bone density scan was in December 2017, so I will schedule this for her.  5.  Vitamin D  deficiency.  Plan: She started hormonal therapy with anastrozole 1 mg daily in December, and has tolerated this without significant difficulty.  We will plan to see her back in 3 months for repeat examination.  She understands and agrees with this plan of care.   I provided *** minutes of face-to-face time during this this encounter and > 50% was spent counseling as documented under my assessment and plan.    Derwood Kaplan, MD [facility] AT [facility] [address] [place] [address] Dept: [phone] Dept Fax: [phone]   I, Rita Ohara, am acting as scribe for Derwood Kaplan, MD  I have reviewed this report as typed by the medical scribe, and it is complete and accurate.

## 2020-09-02 ENCOUNTER — Other Ambulatory Visit: Payer: Self-pay

## 2020-09-02 ENCOUNTER — Inpatient Hospital Stay: Payer: Medicare Other | Attending: Oncology | Admitting: Hematology and Oncology

## 2020-09-02 ENCOUNTER — Encounter: Payer: Self-pay | Admitting: Hematology and Oncology

## 2020-09-02 ENCOUNTER — Telehealth: Payer: Self-pay | Admitting: Hematology and Oncology

## 2020-09-02 DIAGNOSIS — C50212 Malignant neoplasm of upper-inner quadrant of left female breast: Secondary | ICD-10-CM

## 2020-09-02 DIAGNOSIS — Z170 Estrogen receptor positive status [ER+]: Secondary | ICD-10-CM

## 2020-09-02 NOTE — Telephone Encounter (Signed)
Per 2/3 los next appt sched and given to patient 

## 2020-09-02 NOTE — Progress Notes (Signed)
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 female referred by Dr. Orrin Brigham for the evaluation and treatment of breast cancer.  This was found on routine screening mammogram in August 2021.  Diagnostic left mammogram confirmed a lobulated hypoechoic mass in the left breast at 10 o'clock 2 cm from the nipple measuring 5.0 x 4.0 x 5.0 mm.  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  Estrogen receptor was positive at 100% and progesterone receptors were positive at 95%  HER2 was negative.  Ki67 was 10%.  She was treated with lumpectomy and left axillary sentinel lymph node biopsy in October.  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  All margins were clear of neoplasm. One lymph node was negative for malignancy.  Adjuvant radiation therapy was not recommended.  She was started on adjuvant hormonal therapy with anastrozole 51m daily in December.  She has a history of osteoporosis of the spine, for which she has been on alendronate 732mweekly for several years.  Her last bone density scan was in 2017, so we scheduled her for a repeat. She has a history of melanoma of the left foot treated with surgical excision and skin grafting with [place] lymph [place] lymph node was negative.  She also has a history of basal cell carcinoma of the nose. She follows with her dermatologist Dr. WiJimmye Normanegularly.   INTERVAL HISTORY:  Ashley Larson here for routine follow up  and states she continues anastrozole daily without significant difficulty. She reports intermittent vulvar pruritus since starting anastrozole, which is mild-moderate.  She denies vaginal discharge or bleeding.  She tried Vagisil wipes, which helped the itching somewhat. She states she has extremely sensitive skin.  She had an allergic reaction to chlorhexidine with surgery. She really does not wish to take a break from anastrozole. She reports occasional hot flashes, but not increased from previous.  She denies any changes in her breasts.  She denies fever or chills.  She reports left knee pain, which is not increased. She denies myalgias  Her  appetite is good.  Her weight has been stable.  She continues to follow with Dr. MoLilia Prond is scheduled for mammogram in August. Her last colonoscopy was in 2013 which was clear, so she will be due again in 2023.  She undergoes routine lab work at Dr. CaHale Bogusffice and sees him next week.  She states she will no longer be seeing Dr. WiJimmye Normann dermatology, but one of the advanced practice providers  REVIEW OF SYSTEMS:  Review of Systems  Constitutional: Negative for appetite change, chills, fatigue, fever and unexpected weight change.  HENT:   Negative for lump/mass, mouth sores and sore throat.   Respiratory: Negative for cough and shortness of breath.   Cardiovascular: Negative for chest pain and leg swelling.  Gastrointestinal: Negative for abdominal pain, constipation, diarrhea, nausea and vomiting.  Endocrine: Positive for hot flashes (intermittent, mild).  Genitourinary: Negative for difficulty urinating, dysuria,  frequency, hematuria, vaginal bleeding and vaginal discharge.   Musculoskeletal: Negative for arthralgias, back pain and myalgias.  Skin: Positive for itching (intermittent vulvar itching). Negative for rash and wound.  Neurological: Negative for dizziness and headaches.  Hematological: Negative for adenopathy.  Psychiatric/Behavioral:  Negative for depression and sleep disturbance. The patient is not nervous/anxious.      VITALS:  Blood pressure (!) 165/74, pulse 74, temperature 98.1 F (36.7 C), temperature source Oral, resp. rate 18, height 5' (1.524 m), weight 166 lb 14.4 oz (75.7 kg), last menstrual period 07/31/2000, SpO2 98 %.  Wt Readings from Last 3 Encounters:  09/02/20 166 lb 14.4 oz (75.7 kg)  06/02/20 166 lb 8 oz (75.5 kg)    Body mass index is 32.6 kg/m.  Performance status (ECOG): 1 - Symptomatic but completely ambulatory  PHYSICAL EXAM:  Physical Exam Vitals and nursing note reviewed.  Constitutional:      General: She is not in acute distress.    Appearance: Normal appearance.  HENT:     Mouth/Throat:     Mouth: Mucous membranes are moist.     Pharynx: Oropharynx is clear. No oropharyngeal exudate or posterior oropharyngeal erythema.  Eyes:     General: No scleral icterus.    Extraocular Movements: Extraocular movements intact.     Conjunctiva/sclera: Conjunctivae normal.     Pupils: Pupils are equal, round, and reactive to light.  Cardiovascular:     Rate and Rhythm: Normal rate and regular rhythm.     Heart sounds: Normal heart sounds. No murmur heard. No friction rub. No gallop.   Pulmonary:     Effort: No respiratory distress.     Breath sounds: Normal breath sounds. No stridor. No wheezing, rhonchi or rales.  Chest:  Breasts:     Right: Normal. No swelling, bleeding, inverted nipple, mass, nipple discharge, skin change, tenderness, axillary adenopathy or supraclavicular adenopathy.     Left: Skin change (slight remaining dye/glue at surgical site) present. No swelling, bleeding, inverted nipple, mass, nipple discharge, tenderness, axillary adenopathy or supraclavicular adenopathy.    Abdominal:     General: There is no distension.     Palpations: Abdomen is soft. There is no hepatomegaly, splenomegaly or mass.     Tenderness: There is no abdominal tenderness. There is no guarding.      Hernia: No hernia is present.  Musculoskeletal:     Cervical back: Normal range of motion and neck supple. No tenderness.     Right lower leg: No edema.     Left lower leg: No edema.  Lymphadenopathy:     Cervical: No cervical adenopathy.     Upper Body:     Right upper body: No supraclavicular or axillary adenopathy.     Left upper body: No supraclavicular or axillary adenopathy.     Lower Body: No right inguinal adenopathy. No left inguinal adenopathy.  Skin:    General: Skin is warm.     Coloration: Skin is not jaundiced.     Findings: No rash.  Neurological:     Mental Status: She is alert and oriented to person, place, and time.     Cranial Nerves: No cranial nerve deficit.  Psychiatric:        Mood and Affect: Mood normal.        Behavior: Behavior normal.        Thought Content: Thought content normal.    LABS:  No flowsheet data found. No flowsheet data found.  STUDIES:   Exam(s):  [id number] DEXA/DG DEXA EXAM: DUAL X-RAY ABSORPTIOMETRY (DXA) FOR BONE MINERAL DENSITY  IMPRESSION: Ashley Larson completed a BMD test on 06/30/2020 using the [place] [facility] (analysis version: 13.60) manufactured by [employer]. The following summarizes the results of our evaluation. Technologist: [facility] PATIENT BIOGRAPHICAL: Name: Ashley Larson, Ashley Larson Patient ID:  [id number] [place] Birth Date: 12/28/1945 Height:     60.5 in. Gender: Female Exam Date: 06/30/2020 Weight: 160.0 lbs. Indications:              Fractures:            Treatments:  ASSESSMENT: The BMD measured at AP Spine L1-L2 is 0.889 g/cm2 with a T-score of -2.3. This patient is considered osteopenic according to [facility]) criteria.  The scan quality is good. Exclusions: L3 & L4 due to degenerative changes. No significant change in BMD of lumbar spine or total mean hip. Patient does not meet criteria for FRAX assessment due to current bone building therapy.  Site Region Measured Measured WHO  Young Adult BMD Date       Age      Classification T-score  AP Spine L1-L2 06/30/2020 72.8 Osteopenia -2.3 0.889 g/cm2 AP Spine L1-L2 08/03/2009 61.9 Osteoporosis -2.7 0.838 g/cm2  DualFemur Neck Left 06/30/2020 72.8 Osteopenia -1.7 0.804 g/cm2 DualFemur Neck Left 07/27/2016 68.9 Osteopenia -1.7 0.801 g/cm2 DualFemur Neck Left 10/30/2011 64.1 Osteopenia -1.8 0.784 g/cm2 DualFemur Neck Left 08/03/2009 61.9 Osteopenia -2.1 0.746 g/cm2  DualFemur Total Mean 06/30/2020 72.8 Normal -0.9 0.890 g/cm2 DualFemur Total Mean 07/27/2016 68.9 Normal -0.9 0.890 g/cm2 DualFemur Total Mean 10/30/2011 64.1 Osteopenia -1.1 0.871 g/cm2 DualFemur Total Mean 08/03/2009 61.9 Osteopenia -1.4 0.825 g/cm2  World Health Organization [facility]) criteria for post-menopausal, Caucasian Women: Normal:       T-score at or above -1 SD Osteopenia:   T-score between -1 and -2.5 SD Osteoporosis: T-score at or below -2.5 SD  RECOMMENDATIONS: 1. All patients should optimize calcium and vitamin D intake. 2. Consider FDA- approved medical therapies in post menopausal women and men aged 43 years and older, based on the following: a. A hip or vertebral (clinical or morphometric) fracture. b. T- score < 2.5 of the femoral neck or spine after appropriate evaluation to exclude secondary causes. c. Low bone mass (T- score between -1.0 and -2.5 at femoral neck or spine) and a 10 -year probability of a hip fracture > 3% or a 10 -year probability of a major osteoporosis- related fracture > 20% based on the [place]- adapted WHO algorithm. d. Clinician judgement and/ or patient preferences may indicate treatment for people with 10- year fracture probabilities above or below these levels.  FOLLOW-UP: People with diagnosed cases of osteoporosis or at high risk for fracture should have regular bone mineral density tests. For patients eligible for Medicare, routine testing is allowed once every 2 years. The testing frequency can be increased to one  year for patients who have rapidly progressing disease, those who are receiving medical therapy to restore bone mass, or have additional risk factors.  HISTORY:   Allergies:  Allergies  Allergen Reactions  . Wound Dressing Adhesive     Current Medications: Current Outpatient Medications  Medication Sig Dispense Refill  . alendronate (FOSAMAX) 70 MG tablet Take 70 mg by mouth once a week.    Marland Kitchen anastrozole (ARIMIDEX) 1 MG tablet Take 1 tablet (1 mg total) by mouth daily. 30 tablet 5  . cholecalciferol (VITAMIN D3) 25 MCG (1000 UNIT) tablet Take 1,000 Units by mouth  daily.    . ezetimibe (ZETIA) 10 MG tablet Take 10 mg by mouth daily.    Marland Kitchen lisinopril-hydrochlorothiazide (ZESTORETIC) 20-12.5 MG tablet Take 1 tablet by mouth daily.    . ondansetron (ZOFRAN-ODT) 8 MG disintegrating tablet Take 8 mg by mouth every 6 (six) hours.    . simvastatin (ZOCOR) 40 MG tablet Take 40 mg by mouth daily.     No current facility-administered medications for this visit.     ASSESSMENT & PLAN:   Assessment:   1.  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 She remains without evidence of recurrence.  She is currently on adjuvant hormonal therapy with anastrozole and tolerating this fairly well, except for intermittent vulvar pruitis. The Vagisil wipes are fine.  She may benefit from Replens.  She could also use topical steroid spray or cream.  If the itching becomes more frequent or severe or she develops other symptoms, I would recommend holding anastrozole and having her see her gynecologist.  2.  History of melanoma of the left foot, diagnosed in 2008.  This was treated with wide excision in [place] and sentinel lymph node was negative.  She remains without evidence of recurrence.  She follows with her dermatologist Dr. Jimmye Norman yearly, but will now be seeing the APP.  3.  History of basal cell carcinoma of  the nose, treated with excision.  As above, she follows with dermatology regularly.  4.  Osteoporosis of the spine, for which she is on alendronate 70 mg weekly, in addition to calcium and vitamin D. Bone density scan reveals improvement in the osteoporosis of the spine to osteopenia with stable osteopenia of the hip. I will fax the report to Dr. Megan Salon. She will be due for bone density scan again in  December 2023.  5.  History of vitamin D deficiency, she continues vitamin-D.  Plan:  She is experiencing vulvar pruritus, which may be secondary to anastrozole.  As it is not severe, she is willing to continue anastrozole and use topical treatment as needed.  If this worsens, we will have her hold anastrozole to see if it resolves, as well as have her see her gynecologist.  She knows to continue anastrozole daily for now.  She has had improvement of her bone density with calcium/vitamin-D and alendronate, which she will continue as directed by Dr. Megan Salon.  We will plan to see her back in 3 months for repeat examination. The patient understands the plans discussed today and is in agreement with them.  She knows to contact our office if she develops concerns prior to her next appointment.    Marvia Pickles, PA-C [facility] AT [facility] [address] [place] [address] Dept: [phone] Dept Fax: [phone]

## 2020-09-06 ENCOUNTER — Encounter: Payer: Self-pay | Admitting: Hematology and Oncology

## 2020-09-08 DIAGNOSIS — E559 Vitamin D deficiency, unspecified: Secondary | ICD-10-CM | POA: Diagnosis not present

## 2020-09-08 DIAGNOSIS — I10 Essential (primary) hypertension: Secondary | ICD-10-CM | POA: Diagnosis not present

## 2020-09-08 DIAGNOSIS — M8580 Other specified disorders of bone density and structure, unspecified site: Secondary | ICD-10-CM | POA: Diagnosis not present

## 2020-09-08 DIAGNOSIS — Z853 Personal history of malignant neoplasm of breast: Secondary | ICD-10-CM | POA: Diagnosis not present

## 2020-09-08 DIAGNOSIS — E782 Mixed hyperlipidemia: Secondary | ICD-10-CM | POA: Diagnosis not present

## 2020-09-08 DIAGNOSIS — Z79899 Other long term (current) drug therapy: Secondary | ICD-10-CM | POA: Diagnosis not present

## 2020-09-08 DIAGNOSIS — Z6831 Body mass index (BMI) 31.0-31.9, adult: Secondary | ICD-10-CM | POA: Diagnosis not present

## 2020-11-17 DIAGNOSIS — E669 Obesity, unspecified: Secondary | ICD-10-CM | POA: Diagnosis not present

## 2020-11-17 DIAGNOSIS — Z1331 Encounter for screening for depression: Secondary | ICD-10-CM | POA: Diagnosis not present

## 2020-11-17 DIAGNOSIS — Z9181 History of falling: Secondary | ICD-10-CM | POA: Diagnosis not present

## 2020-11-17 DIAGNOSIS — Z0000 Encounter for general adult medical examination without abnormal findings: Secondary | ICD-10-CM | POA: Diagnosis not present

## 2020-11-17 DIAGNOSIS — E785 Hyperlipidemia, unspecified: Secondary | ICD-10-CM | POA: Diagnosis not present

## 2020-11-30 ENCOUNTER — Other Ambulatory Visit: Payer: Self-pay

## 2020-11-30 ENCOUNTER — Telehealth: Payer: Self-pay | Admitting: Hematology and Oncology

## 2020-11-30 ENCOUNTER — Inpatient Hospital Stay: Payer: Medicare Other | Attending: Oncology | Admitting: Hematology and Oncology

## 2020-11-30 ENCOUNTER — Encounter: Payer: Self-pay | Admitting: Hematology and Oncology

## 2020-11-30 DIAGNOSIS — C50212 Malignant neoplasm of upper-inner quadrant of left female breast: Secondary | ICD-10-CM | POA: Diagnosis not present

## 2020-11-30 DIAGNOSIS — Z170 Estrogen receptor positive status [ER+]: Secondary | ICD-10-CM | POA: Diagnosis not present

## 2020-11-30 MED ORDER — ANASTROZOLE 1 MG PO TABS: 1.0000 mg | ORAL_TABLET | Freq: Every day | ORAL | 3 refills | Status: DC

## 2020-11-30 NOTE — Progress Notes (Signed)
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 This was found on routine screening mammogram in August 2021.  Diagnostic left mammogram confirmed a lobulated hypoechoic mass in the left breast at 10 o'clock 2 cm from the nipple measuring 5.0 x 4.0 x 5.0 mm.  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  Estrogen receptor was positive at 100% and progesterone receptors were positive at 95%  HER2 was negative.  Ki67 was 10%.  She was treated with lumpectomy and left axillary sentinel lymph node biopsy in October.  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  All margins were clear of neoplasm. One lymph node was negative for malignancy.  Adjuvant radiation therapy was not recommended.  She was started on adjuvant hormonal therapy with anastrozole 64m daily in December.  She has a history of osteoporosis of the spine, for which she has been on alendronate 791mweekly for several years.  Her last bone density scan was in 2017, so we scheduled her for a repeat. She has a history of melanoma of the left foot treated with surgical excision and skin grafting with sentinel lymph node biopsy [place] years ago [place], the lymph node was negative.  She also has a history of basal cell carcinoma of the nose. She follows with her dermatologist Dr. WiJimmye Normanegularly. She had an allergic reaction to chlorhexidine  with surgery.  Bone density scan in December 2021 revealed stable to improved osteopenia with a T-score of -2.3 in the spine and a T-score of -1.7 in the femur, for which she continues alendronate and calcium/vitamin-D.  INTERVAL HISTORY:  [name] here for repeat examination and states she continues anastrozole daily without significant difficulty.  She denies any changes in her breasts. She reports occasional hot flashes, improved from previous.  She denies vaginal discharge or bleeding. She denies increased arthralgias or myalgias.  Her  appetite is good.  Her weight has been stable. Her systolic blood pressure is elevated, but she states she just took her medication 30 minutes ago.  She continues to follow with Dr. MoLilia Prond is scheduled for mammogram in August. She states she is up-to-date on screening colonoscopy and will be due in 2024. She undergoes routine lab work at Dr. CaHale Bogusffice.  She continues to follow with Dermatology.  REVIEW OF SYSTEMS:  Review of Systems  Constitutional: Negative for appetite change, chills, fatigue, fever and unexpected weight change.  HENT:   Negative for lump/mass, mouth sores and sore throat.   Respiratory: Negative for cough and shortness of breath.   Cardiovascular: Negative for chest pain and leg swelling.  Gastrointestinal: Negative for abdominal pain, constipation, diarrhea, nausea and vomiting.  Endocrine: Positive for hot flashes (Occasional).  Genitourinary: Negative for difficulty urinating, dysuria, frequency and hematuria.   Musculoskeletal: Negative for arthralgias, back pain and myalgias.  Skin: Negative for rash.  Neurological: Negative for dizziness and headaches.  Hematological: Negative for adenopathy. Does not bruise/bleed easily.  Psychiatric/Behavioral: Negative for depression and sleep disturbance. The patient is not nervous/anxious.      VITALS:  Blood pressure (!) 186/74, pulse 78, temperature 98 F (36.7 C),  temperature source Oral, resp. rate 18, height 5' (1.524 m), weight 166 lb 9.6 oz (75.6 kg), last menstrual period 07/31/2000, SpO2 97 %.  Wt Readings from Last 3 Encounters:  11/30/20 166 lb 9.6 oz (75.6 kg)  09/02/20 166 lb 14.4 oz (75.7 kg)  06/02/20 166 lb 8 oz (75.5 kg)    Body mass index is 32.54 kg/m.  Performance status (ECOG): 0 - Asymptomatic  PHYSICAL EXAM:  Physical Exam Vitals and nursing note reviewed.  Constitutional:      General: She is not in acute distress.    Appearance: Normal appearance.  HENT:     Head: Normocephalic and atraumatic.     Mouth/Throat:     Mouth: Mucous membranes are moist.     Pharynx: Oropharynx is clear. No oropharyngeal exudate or posterior oropharyngeal erythema.  Eyes:     General: No scleral icterus.    Extraocular Movements: Extraocular movements intact.     Conjunctiva/sclera: Conjunctivae normal.     Pupils: Pupils are equal, round, and reactive to light.  Cardiovascular:     Rate and Rhythm: Normal rate and regular rhythm.     Heart sounds: Normal heart sounds. No murmur heard. No friction rub. No gallop.   Pulmonary:     Effort: Pulmonary effort is normal.     Breath sounds: Normal breath sounds. No wheezing, rhonchi or rales.  Chest:  Breasts:     Right: Normal. No swelling, bleeding, inverted nipple, mass, nipple discharge, skin change, tenderness or axillary adenopathy.     Left: Normal. No swelling, bleeding, inverted nipple, mass, nipple discharge, skin change, tenderness or axillary adenopathy.    Abdominal:     General: There is no distension.     Palpations: Abdomen is soft. There is no hepatomegaly, splenomegaly or mass.     Tenderness: There is no abdominal tenderness.  Musculoskeletal:        General: Normal range of motion.     Cervical back: Normal range of motion and neck supple. No tenderness.     Right lower leg: No edema.     Left lower leg: No edema.  Lymphadenopathy:     Cervical: No cervical  adenopathy.     Upper Body:     Right upper body: No axillary adenopathy.     Left upper body: No axillary adenopathy.     Lower Body: No right inguinal adenopathy. No left inguinal adenopathy.  Skin:    General: Skin is warm and dry.     Coloration: Skin is not jaundiced.     Findings: No rash.  Neurological:     Mental Status: She is alert and oriented to person, place, and time.     Cranial Nerves: No cranial nerve deficit.  Psychiatric:        Mood and Affect: Mood normal.        Behavior: Behavior normal.        Thought Content: Thought content normal.     LABS:  No flowsheet data found. No flowsheet data found.  STUDIES:    HISTORY:   Allergies:  Allergies  Allergen Reactions  . Wound Dressing Adhesive     Current Medications: Current Outpatient Medications  Medication Sig Dispense Refill  . alendronate (FOSAMAX) 70 MG  tablet Take 70 mg by mouth once a week.    Marland Kitchen anastrozole (ARIMIDEX) 1 MG tablet Take 1 tablet (1 mg total) by mouth daily. 90 tablet 3  . cholecalciferol (VITAMIN D3) 25 MCG (1000 UNIT) tablet Take 1,000 Units by mouth daily.    Marland Kitchen ezetimibe (ZETIA) 10 MG tablet Take 10 mg by mouth daily.    Marland Kitchen lisinopril-hydrochlorothiazide (ZESTORETIC) 20-12.5 MG tablet Take 1 tablet by mouth daily.    . ondansetron (ZOFRAN-ODT) 8 MG disintegrating tablet Take 8 mg by mouth every 6 (six) hours.    . simvastatin (ZOCOR) 40 MG tablet Take 40 mg by mouth daily.     No current facility-administered medications for this visit.     ASSESSMENT & PLAN:   Assessment:   1.  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 She remains without evidence of recurrence.  She is currently on adjuvant hormonal therapy with anastrozole and tolerating this fairly well.  2.  History of melanoma of the left foot, diagnosed in 2008.  This was treated with wide excision in [place] and sentinel  lymph node was negative.  She remains without evidence of recurrence.  She continues to follow with Dermatology.  3.  History of basal cell carcinoma of the nose, treated with excision.  4.  Osteopenia, for which she is onalendronate 70 mg weekly, in addition to calcium and vitamin D.  She will be due for bone density scan again in  December 2023.  5.  History of vitamin D deficiency, she continues vitamin-D.  Plan:   She knows to continue anastrozole daily.  She knows to continue alendronate as prescribed, as well as calcium /vitamin-D.  We will plan to see her back in 3 months for repeat examination. The patient understands the plans discussed today and is in agreement with them.  She knows to contact our office if she develops concerns prior to her next appointment.    Marvia Pickles, PA-C [facility] AT [facility] [address] [place] [address] Dept: [phone] Dept Fax: [phone]

## 2020-11-30 NOTE — Telephone Encounter (Signed)
Per 5/3 los next appt scheduled and given to patient 

## 2021-01-06 DIAGNOSIS — M8580 Other specified disorders of bone density and structure, unspecified site: Secondary | ICD-10-CM | POA: Diagnosis not present

## 2021-01-06 DIAGNOSIS — I10 Essential (primary) hypertension: Secondary | ICD-10-CM | POA: Diagnosis not present

## 2021-01-06 DIAGNOSIS — Z79899 Other long term (current) drug therapy: Secondary | ICD-10-CM | POA: Diagnosis not present

## 2021-01-06 DIAGNOSIS — Z6831 Body mass index (BMI) 31.0-31.9, adult: Secondary | ICD-10-CM | POA: Diagnosis not present

## 2021-01-06 DIAGNOSIS — Z853 Personal history of malignant neoplasm of breast: Secondary | ICD-10-CM | POA: Diagnosis not present

## 2021-01-06 DIAGNOSIS — E782 Mixed hyperlipidemia: Secondary | ICD-10-CM | POA: Diagnosis not present

## 2021-01-06 DIAGNOSIS — E559 Vitamin D deficiency, unspecified: Secondary | ICD-10-CM | POA: Diagnosis not present

## 2021-02-22 DIAGNOSIS — D485 Neoplasm of uncertain behavior of skin: Secondary | ICD-10-CM | POA: Diagnosis not present

## 2021-03-09 NOTE — Progress Notes (Signed)
[place]  [address] [place],  [place]  [address] [phone]  Clinic Day:  03/16/2021  Referring physician: Helen Hashimoto., MD   This document serves as a record of services personally performed by Hosie Poisson, MD. It was created on their behalf by [facility], a trained medical scribe. The creation of this record is based on the scribe's personal observations and the provider's statements to them.  CHIEF COMPLAINT:  CC: Stage IA hormone receptor positive left breast cancer  Current Treatment:  Anastrozole 1 mg daily for a total of 5 years   HISTORY OF PRESENT ILLNESS:  Ashley Larson is a 75 y.o. 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 This was found on routine screening mammogram in August 2021.  Diagnostic left mammogram confirmed a lobulated hypoechoic mass in the left breast at 10 o'clock 2 cm from the nipple measuring 5.0 x 4.0 x 5.0 mm.  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  Estrogen receptor was positive at 100% and progesterone receptors were positive at 95%  HER2 was negative.  Ki67 was 10%.  She was treated with lumpectomy and left axillary sentinel lymph node biopsy in October.  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  All margins were clear of neoplasm. One lymph node was negative for malignancy.  Adjuvant radiation therapy was not recommended.  She was started on adjuvant hormonal therapy with anastrozole 16m daily in December.  She has a history of osteoporosis of the spine, for which she has been on alendronate 772mweekly for several years.  Her last bone density scan was in 2017, so we scheduled her for a repeat. She has a history of melanoma of the left foot treated with surgical excision and  skin grafting [place] node was negative.  She also has a history of basal cell carcinoma of the nose. She follows with her dermatologist Dr. WiJimmye Normanegularly. She had an allergic reaction to chlorhexidine with surgery.  Bone density scan in December 2021 revealed stable to improved osteopenia with a T-score of -2.3 in the spine and a T-score of -1.7 in the femur, for which she continues alendronate and calcium/vitamin-D.  INTERVAL HISTORY:  [name] here for routine follow up and states that she has been well.  She continues anastrozole daily without significant difficulty.  Annual mammography is scheduled in 2 weeks, and she will see Dr. MoLilia Pron September.  She notes her usual knee pain, but otherwise, denies complaints.  She continues to follow with Dermatology and will see him again in December.  She undergoes routine blood work with Dr. CaMegan Salon Her  appetite is good, and her weight is stable since her last visit.  She denies fever, chills or other signs of infection.  She denies nausea, vomiting, bowel issues, or abdominal pain.  She denies sore throat, cough, dyspnea, or chest pain.  REVIEW OF SYSTEMS:  Review of Systems  Constitutional: Negative.  Negative for appetite change, chills, fatigue, fever and unexpected weight change.  HENT:  Negative.    Eyes: Negative.   Respiratory: Negative.  Negative for chest tightness, cough, hemoptysis, shortness of breath and wheezing.   Cardiovascular: Negative.  Negative for chest pain, leg swelling and  palpitations.  Gastrointestinal: Negative.  Negative for abdominal distention, abdominal pain, blood in stool, constipation, diarrhea, nausea and vomiting.  Endocrine: Negative.   Genitourinary: Negative.  Negative for difficulty urinating, dysuria, frequency and hematuria.   Musculoskeletal: Negative.  Negative for arthralgias, back pain, flank pain, gait problem and  myalgias.  Skin: Negative.   Neurological: Negative.  Negative for dizziness, extremity weakness, gait problem, headaches, light-headedness, numbness, seizures and speech difficulty.  Hematological: Negative.   Psychiatric/Behavioral: Negative.  Negative for depression and sleep disturbance. The patient is not nervous/anxious.     VITALS:  Blood pressure (!) 185/81, pulse 73, temperature 97.9 F (36.6 C), temperature source Oral, resp. rate 18, height 5' (1.524 m), weight 165 lb 4.8 oz (75 kg), last menstrual period 07/31/2000, SpO2 97 %.  Wt Readings from Last 3 Encounters:  03/16/21 165 lb 4.8 oz (75 kg)  11/30/20 166 lb 9.6 oz (75.6 kg)  09/02/20 166 lb 14.4 oz (75.7 kg)    Body mass index is 32.28 kg/m.  Performance status (ECOG): 0 - Asymptomatic  PHYSICAL EXAM:  Physical Exam Constitutional:      General: She is not in acute distress.    Appearance: Normal appearance. She is normal weight.  HENT:     Head: Normocephalic and atraumatic.  Eyes:     General: No scleral icterus.    Extraocular Movements: Extraocular movements intact.     Conjunctiva/sclera: Conjunctivae normal.     Pupils: Pupils are equal, round, and reactive to light.  Cardiovascular:     Rate and Rhythm: Normal rate and regular rhythm.     Pulses: Normal pulses.     Heart sounds: Normal heart sounds. No murmur heard.   No friction rub. No gallop.  Pulmonary:     Effort: Pulmonary effort is normal. No respiratory distress.     Breath sounds: Normal breath sounds.  Chest:  Breasts:    Right: Normal.     Left: Normal.     Comments: Both breasts are without masses. Well healed scar in the lower inner quadrant of the left breast.  Abdominal:     General: Bowel sounds are normal. There is no distension.     Palpations: Abdomen is soft. There is no hepatomegaly, splenomegaly or mass.     Tenderness: There is no abdominal tenderness.  Musculoskeletal:        General: Normal range of motion.     Cervical  back: Normal range of motion and neck supple.     Right lower leg: No edema.     Left lower leg: No edema.  Lymphadenopathy:     Cervical: No cervical adenopathy.  Skin:    General: Skin is warm and dry.  Neurological:     General: No focal deficit present.     Mental Status: She is alert and oriented to person, place, and time. Mental status is at baseline.  Psychiatric:        Mood and Affect: Mood normal.        Behavior: Behavior normal.        Thought Content: Thought content normal.        Judgment: Judgment normal.    LABS:  No flowsheet data found. No flowsheet data found.  STUDIES:    HISTORY:   Allergies:  Allergies  Allergen Reactions   Wound Dressing Adhesive     Current Medications: Current Outpatient Medications  Medication Sig Dispense Refill   alendronate (FOSAMAX) 70 MG tablet Take 70 mg by  mouth once a week.     anastrozole (ARIMIDEX) 1 MG tablet Take 1 tablet (1 mg total) by mouth daily. 90 tablet 3   cholecalciferol (VITAMIN D3) 25 MCG (1000 UNIT) tablet Take 1,000 Units by mouth daily.     ezetimibe (ZETIA) 10 MG tablet Take 10 mg by mouth daily.     lisinopril-hydrochlorothiazide (ZESTORETIC) 20-12.5 MG tablet Take 1 tablet by mouth daily.     metoprolol succinate (TOPROL-XL) 25 MG 24 hr tablet Take 25 mg by mouth daily.     ondansetron (ZOFRAN-ODT) 8 MG disintegrating tablet Take 8 mg by mouth every 6 (six) hours.     simvastatin (ZOCOR) 40 MG tablet Take 40 mg by mouth daily.     No current facility-administered medications for this visit.     ASSESSMENT & PLAN:   Assessment:   1.  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 She remains without evidence of recurrence.  She is currently on adjuvant hormonal therapy with anastrozole and tolerating this fairly well.  2.  History of melanoma of the left foot, diagnosed in 2008.  This was treated with wide  excision in [place] and sentinel lymph node was negative.  She remains without evidence of recurrence.  She continues to follow with Dermatology.  3.  History of basal cell carcinoma of the nose, treated with excision.  4.  Osteopenia, for which she is onalendronate 70 mg weekly, in addition to calcium and vitamin D.  She will be due for bone density scan again in December 2023.  5.  History of vitamin D deficiency, she continues vitamin-D.  6.  Irregular dark nevus in the right axillary line.  She continues to have skin checks through her dermatologist on a regular basis.  Plan:    Annual mammogram is scheduled in 2 weeks, and she will follow up with Dr. Lilia Pro in September.  She knows to continue anastrozole daily.  She knows to continue alendronate as prescribed, as well as calcium /vitamin-D.  We will plan to see her back in 3 months for repeat examination.  After that appointment, we can change her to every 4 months as she will be 1 year out from her diagnosis.  The patient understands the plans discussed today and is in agreement with them.  She knows to contact our office if she develops concerns prior to her next appointment.    Derwood Kaplan, MD [facility] AT [facility] [address] [place] [address] Dept: [phone] Dept Fax: [phone]     I, Rita Ohara, am acting as scribe for Derwood Kaplan, MD  I have reviewed this report as typed by the medical scribe, and it is complete and accurate.

## 2021-03-16 ENCOUNTER — Encounter: Payer: Self-pay | Admitting: Oncology

## 2021-03-16 ENCOUNTER — Inpatient Hospital Stay: Payer: Medicare Other | Attending: Oncology | Admitting: Oncology

## 2021-03-16 ENCOUNTER — Telehealth: Payer: Self-pay | Admitting: Oncology

## 2021-03-16 ENCOUNTER — Other Ambulatory Visit: Payer: Self-pay

## 2021-03-16 VITALS — BP 185/81 | HR 73 | Temp 97.9°F | Resp 18 | Ht 60.0 in | Wt 165.3 lb

## 2021-03-16 DIAGNOSIS — C50012 Malignant neoplasm of nipple and areola, left female breast: Secondary | ICD-10-CM | POA: Diagnosis not present

## 2021-03-16 DIAGNOSIS — Z170 Estrogen receptor positive status [ER+]: Secondary | ICD-10-CM | POA: Diagnosis not present

## 2021-03-16 NOTE — Telephone Encounter (Signed)
Per 8/17 LOS next appt scheduled and confirmed with patient 

## 2021-03-30 DIAGNOSIS — C50312 Malignant neoplasm of lower-inner quadrant of left female breast: Secondary | ICD-10-CM | POA: Diagnosis not present

## 2021-03-30 DIAGNOSIS — Z853 Personal history of malignant neoplasm of breast: Secondary | ICD-10-CM | POA: Diagnosis not present

## 2021-03-30 DIAGNOSIS — R922 Inconclusive mammogram: Secondary | ICD-10-CM | POA: Diagnosis not present

## 2021-04-06 DIAGNOSIS — C50312 Malignant neoplasm of lower-inner quadrant of left female breast: Secondary | ICD-10-CM | POA: Diagnosis not present

## 2021-04-18 ENCOUNTER — Telehealth: Payer: Self-pay

## 2021-04-18 NOTE — Telephone Encounter (Signed)
-----   Message from Derwood Kaplan, MD sent at 04/13/2021  7:28 AM EDT ----- Regarding: RE: mammo Pls see if mammo done ----- Message ----- From: Derwood Kaplan, MD Sent: 03/30/2021  12:00 AM EDT To: Derwood Kaplan, MD Subject: Clotilde Dieter

## 2021-04-18 NOTE — Telephone Encounter (Signed)
Printed Mammo and placed in basket for MD to see

## 2021-05-11 DIAGNOSIS — Z139 Encounter for screening, unspecified: Secondary | ICD-10-CM | POA: Diagnosis not present

## 2021-05-11 DIAGNOSIS — Z79899 Other long term (current) drug therapy: Secondary | ICD-10-CM | POA: Diagnosis not present

## 2021-05-11 DIAGNOSIS — E559 Vitamin D deficiency, unspecified: Secondary | ICD-10-CM | POA: Diagnosis not present

## 2021-05-11 DIAGNOSIS — Z6830 Body mass index (BMI) 30.0-30.9, adult: Secondary | ICD-10-CM | POA: Diagnosis not present

## 2021-05-11 DIAGNOSIS — Z23 Encounter for immunization: Secondary | ICD-10-CM | POA: Diagnosis not present

## 2021-05-11 DIAGNOSIS — M8580 Other specified disorders of bone density and structure, unspecified site: Secondary | ICD-10-CM | POA: Diagnosis not present

## 2021-05-11 DIAGNOSIS — Z853 Personal history of malignant neoplasm of breast: Secondary | ICD-10-CM | POA: Diagnosis not present

## 2021-05-11 DIAGNOSIS — I10 Essential (primary) hypertension: Secondary | ICD-10-CM | POA: Diagnosis not present

## 2021-05-11 DIAGNOSIS — E782 Mixed hyperlipidemia: Secondary | ICD-10-CM | POA: Diagnosis not present

## 2021-06-15 ENCOUNTER — Encounter: Payer: Self-pay | Admitting: Hematology and Oncology

## 2021-06-16 ENCOUNTER — Inpatient Hospital Stay: Payer: Medicare Other | Attending: Hematology and Oncology | Admitting: Hematology and Oncology

## 2021-06-16 ENCOUNTER — Telehealth: Payer: Self-pay | Admitting: Hematology and Oncology

## 2021-06-16 ENCOUNTER — Encounter: Payer: Self-pay | Admitting: Hematology and Oncology

## 2021-06-16 ENCOUNTER — Other Ambulatory Visit: Payer: Self-pay

## 2021-06-16 DIAGNOSIS — C50212 Malignant neoplasm of upper-inner quadrant of left female breast: Secondary | ICD-10-CM | POA: Diagnosis not present

## 2021-06-16 DIAGNOSIS — Z780 Asymptomatic menopausal state: Secondary | ICD-10-CM | POA: Diagnosis not present

## 2021-06-16 DIAGNOSIS — Z170 Estrogen receptor positive status [ER+]: Secondary | ICD-10-CM

## 2021-06-16 DIAGNOSIS — M8580 Other specified disorders of bone density and structure, unspecified site: Secondary | ICD-10-CM

## 2021-06-16 NOTE — Progress Notes (Signed)
[facility]  [address] 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 She remains without evidence of recurrence.  She is currently on adjuvant hormonal therapy with anastrozole and tolerating this fairly well.  She knows to continue anastrozole daily.  We will plan to see her back in 3 months for reexamination.  Osteopenia after menopause Osteopenia, for which she is on alendronate 70 mg weekly, in addition to calcium and vitamin D.  She knows to continue these as recommended. She will be due for bone density scan again in  December 2023.    The patient understands the plans discussed today and is in agreement with them.  She knows to contact our office if she develops concerns prior to her next appointment.     Ashley Pickles, PA-C  [facility] AT [facility] [address] [place] [address] Dept: [phone] Dept Fax: [phone]   No orders of the defined types were placed in this encounter.     CHIEF COMPLAINT:  CC: Stage IA hormone receptor positive breast cancer  Current Treatment:  Anastrozole 1 mg daily   HISTORY OF PRESENT ILLNESS:  Ashley Larson is a 75 y.o. 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 This was found on routine screening mammogram in August 2021.  Diagnostic left mammogram confirmed a lobulated hypoechoic mass in the left breast at 10 o'clock 2 cm from the nipple measuring 5.0 x 4.0 x 5.0 mm.  Stereotactic biopsy revealed invasive ductal carcinoma and  ductal carcinoma in situ.  Estrogen receptor was positive at 100% and progesterone receptors were positive at 95%  HER2 was negative.  Ki67 was 10%.  She was treated with lumpectomy and left axillary sentinel lymph node biopsy in October.  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  All margins were clear of neoplasm. One lymph node was negative for malignancy.  Adjuvant radiation therapy was not recommended.  She was started on adjuvant hormonal therapy with anastrozole 29m daily in December.  She has a history of osteoporosis of the spine, for which she has been on alendronate 740mweekly for several years.  Her last bone density scan was in 2017, so we scheduled her for a repeat. She has a history of melanoma of the left foot treated with surgical excision and skin grafting with sentinel lymph node biopsy [place] years ago [place], the lymph node was negative.  She also has a history of basal cell carcinoma of the nose. She follows with her dermatologist Dr. WiJimmye Normanegularly. She had an allergic reaction to chlorhexidine with surgery.  Bone density scan in December 2021 revealed stable to improved osteopenia with a T-score of -2.3 in the spine and a T-score of -1.7 in the femur, for which she continues alendronate and calcium/vitamin-D.  INTERVAL HISTORY:  Ashley Larson here today for repeat clinical assessment and states she continues anastrozole without significant difficulty. She denies any changes in her breasts. She denies  fevers or chills. She denies pain. Her appetite is good. Her weight has been stable. She continues to follow with Dr. Lilia Pro.  Bilateral diagnostic mammogram in August 2022 did not reveal any evidence of disease.  REVIEW OF SYSTEMS:  Review of Systems  Constitutional:  Negative for appetite change, chills, fatigue, fever and unexpected weight change.   HENT:   Negative for lump/mass, mouth sores and sore throat.   Respiratory:  Negative for cough and shortness of breath.   Cardiovascular:  Negative for chest pain and leg swelling.  Gastrointestinal:  Negative for abdominal pain, constipation, diarrhea, nausea and vomiting.  Endocrine: Negative for hot flashes.  Genitourinary:  Negative for difficulty urinating, dysuria, frequency and hematuria.   Musculoskeletal:  Negative for arthralgias, back pain and myalgias.  Skin:  Negative for rash.  Neurological:  Negative for dizziness and headaches.  Hematological:  Negative for adenopathy. Does not bruise/bleed easily.  Psychiatric/Behavioral:  Negative for depression and sleep disturbance. The patient is not nervous/anxious.     VITALS:  Blood pressure (!) 158/71, pulse 67, temperature 97.7 F (36.5 C), resp. rate 18, weight 165 lb (74.8 kg), last menstrual period 07/31/2000, SpO2 97 %.  Wt Readings from Last 3 Encounters:  06/16/21 165 lb (74.8 kg)  03/16/21 165 lb 4.8 oz (75 kg)  11/30/20 166 lb 9.6 oz (75.6 kg)    Body mass index is 32.22 kg/m.  Performance status (ECOG): 0 - Asymptomatic  PHYSICAL EXAM:  Physical Exam Vitals and nursing note reviewed.  Constitutional:      General: She is not in acute distress.    Appearance: Normal appearance.  HENT:     Head: Normocephalic and atraumatic.     Mouth/Throat:     Mouth: Mucous membranes are moist.     Pharynx: Oropharynx is clear. No oropharyngeal exudate or posterior oropharyngeal erythema.  Eyes:     General: No scleral icterus.    Extraocular Movements: Extraocular movements intact.     Conjunctiva/sclera: Conjunctivae normal.     Pupils: Pupils are equal, round, and reactive to light.  Cardiovascular:     Rate and Rhythm: Normal rate and regular rhythm.     Heart sounds: Normal heart sounds. No murmur heard.   No friction rub. No gallop.  Pulmonary:     Effort: Pulmonary effort is normal.     Breath sounds: Normal  breath sounds. No wheezing, rhonchi or rales.  Chest:  Breasts:    Right: Normal. No swelling, bleeding, inverted nipple, mass, nipple discharge, skin change or tenderness.     Left: Normal. No swelling, bleeding, inverted nipple, mass, nipple discharge, skin change or tenderness.  Abdominal:     General: There is no distension.     Palpations: Abdomen is soft. There is no hepatomegaly, splenomegaly or mass.     Tenderness: There is no abdominal tenderness.  Musculoskeletal:        General: Normal range of motion.     Cervical back: Normal range of motion and neck supple. No tenderness.     Right lower leg: No edema.     Left lower leg: No edema.  Lymphadenopathy:     Cervical: No cervical adenopathy.     Upper Body:     Right upper body: No supraclavicular or axillary adenopathy.     Left upper body: No supraclavicular or axillary adenopathy.     Lower Body: No right inguinal adenopathy. No left inguinal adenopathy.  Skin:    General: Skin is warm  and dry.     Coloration: Skin is not jaundiced.     Findings: No rash.  Neurological:     Mental Status: She is alert and oriented to person, place, and time.     Cranial Nerves: No cranial nerve deficit.  Psychiatric:        Mood and Affect: Mood normal.        Behavior: Behavior normal.        Thought Content: Thought content normal.    LABS:  No flowsheet data found. No flowsheet data found.   No results found for: CEA1 / No results found for: CEA1 No results found for: PSA1 No results found for: XNT700 No results found for: CAN125  No results found for: TOTALPROTELP, ALBUMINELP, A1GS, A2GS, BETS, BETA2SER, GAMS, MSPIKE, SPEI No results found for: TIBC, FERRITIN, IRONPCTSAT No results found for: LDH  STUDIES:  No results found.    Exam(s): [id number] MAM/MAM DIGITAL W/TOMO DIAG B  CLINICAL DATA: History of left breast cancer status post lumpectomy  in 2021.  EXAM:  DIGITAL DIAGNOSTIC BILATERAL MAMMOGRAM WITH  TOMOSYNTHESIS AND CAD  TECHNIQUE:  Bilateral digital diagnostic mammography and breast tomosynthesis  was performed. The images were evaluated with computer-aided  detection.  COMPARISON: Previous exam(s).  ACR Breast Density Category b: There are scattered areas of  fibroglandular density.  FINDINGS:  Lumpectomy changes are seen in the left breast. No suspicious mass  or malignant type microcalcifications identified in either breast.  IMPRESSION:  No evidence of malignancy in either breast.  RECOMMENDATION:  Bilateral diagnostic mammogram in 1 year is recommended.   HISTORY:   Past Medical History:  Diagnosis Date   Allergy    Arthritis    Breast cancer ([place])    Hyperlipidemia    Hypertension    Melanoma of foot, left ([place]) 2008   treated with excision   Osteopenia    Skin cancer    History of basal cell carcinoma   Vitamin D deficiency     Past Surgical History:  Procedure Laterality Date   APPENDECTOMY     BASAL CELL CARCINOMA EXCISION  2014   SCALP   BREAST LUMPECTOMY WITH AXILLARY LYMPH NODE BIOPSY Left 05/17/2020   MELANOMA EXCISION WITH SENTINEL LYMPH NODE BIOPSY Left 2008   FOOT   TONSILLECTOMY  1954    Family History  Problem Relation Age of Onset   Prostate cancer Father        in his 43s   Pancreatic cancer Maternal Aunt        in her 72s    Social History:  reports that she quit smoking about 60 years ago. Her smoking use included cigarettes. She started smoking about 62 years ago. She has a 1.00 pack-year smoking history. She has never used smokeless tobacco. She reports current alcohol use of about 1.0 standard drink per week. She reports that she does not use drugs.The patient is alone today.  Allergies:  Allergies  Allergen Reactions   Wound Dressing Adhesive    Chlorhexidine Rash    Red raised rash with surrounding redness     Current Medications: Current Outpatient Medications  Medication Sig Dispense Refill   alendronate (FOSAMAX)  70 MG tablet Take 70 mg by mouth once a week.     anastrozole (ARIMIDEX) 1 MG tablet Take 1 tablet (1 mg total) by mouth daily. 90 tablet 3   cholecalciferol (VITAMIN D3) 25 MCG (1000 UNIT) tablet Take 1,000 Units by mouth daily.  ezetimibe (ZETIA) 10 MG tablet Take 10 mg by mouth daily.     lisinopril-hydrochlorothiazide (ZESTORETIC) 20-12.5 MG tablet Take 1 tablet by mouth daily.     metoprolol succinate (TOPROL-XL) 25 MG 24 hr tablet Take 25 mg by mouth daily.     ondansetron (ZOFRAN-ODT) 8 MG disintegrating tablet Take 8 mg by mouth every 6 (six) hours.     simvastatin (ZOCOR) 40 MG tablet Take 40 mg by mouth daily.     No current facility-administered medications for this visit.

## 2021-06-16 NOTE — Telephone Encounter (Signed)
Per 11/17 los next appt scheduled and given to patient

## 2021-06-16 NOTE — Assessment & Plan Note (Addendum)
Osteopenia, for which she is on alendronate 70 mg weekly, in addition to calcium and vitamin D.  She knows to continue these as recommended. She will be due for bone density scan again in  December 2023.

## 2021-06-16 NOTE — Assessment & Plan Note (Addendum)
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 She remains without evidence of recurrence.  She is currently on adjuvant hormonal therapy with anastrozole and tolerating this fairly well.  She knows to continue anastrozole daily.  We will plan to see her back in 3 months for reexamination.

## 2021-07-06 DIAGNOSIS — L821 Other seborrheic keratosis: Secondary | ICD-10-CM | POA: Diagnosis not present

## 2021-07-06 DIAGNOSIS — Z85820 Personal history of malignant melanoma of skin: Secondary | ICD-10-CM | POA: Diagnosis not present

## 2021-07-06 DIAGNOSIS — L814 Other melanin hyperpigmentation: Secondary | ICD-10-CM | POA: Diagnosis not present

## 2021-07-06 DIAGNOSIS — D225 Melanocytic nevi of trunk: Secondary | ICD-10-CM | POA: Diagnosis not present

## 2021-08-01 DIAGNOSIS — Z23 Encounter for immunization: Secondary | ICD-10-CM | POA: Diagnosis not present

## 2021-08-15 IMAGING — MG MM BREAST BX W LOC DEV 1ST LESION IMAGE BX SPEC STEREO GUIDE*L*
8 of 13 series · 8 of 29 positions shown · non-contrast
Comparison: Previous exams.
COMPARISON: Previous exams.

Addendum:
CLINICAL DATA: 7 mm mass in the lower inner quadrant of the left
breast at recent mammography with no ultrasound correlate.

EXAM:
LEFT BREAST STEREOTACTIC CORE NEEDLE BIOPSY

[L (1 of 8)]
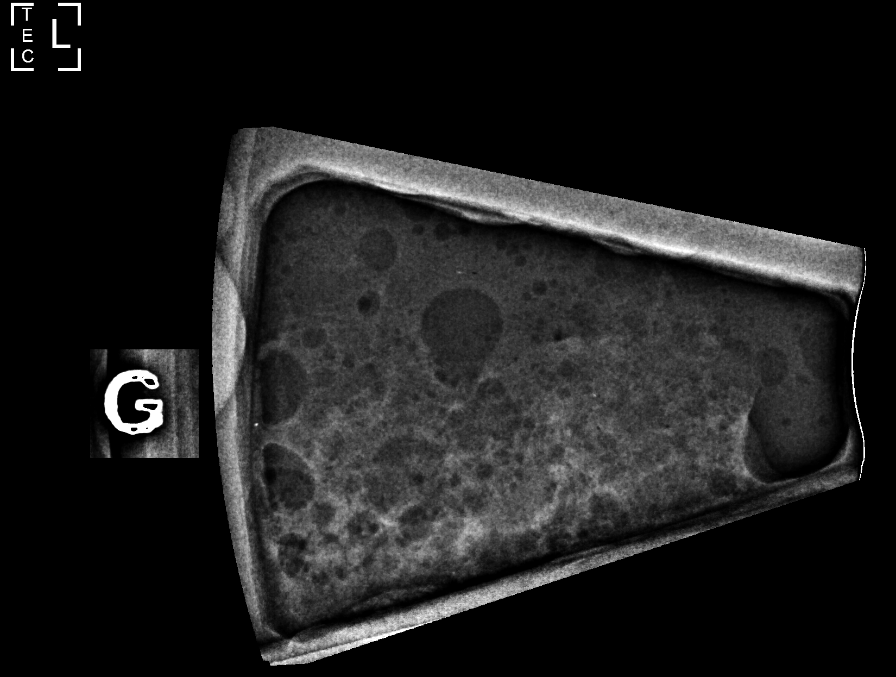

[L (2 of 8)]
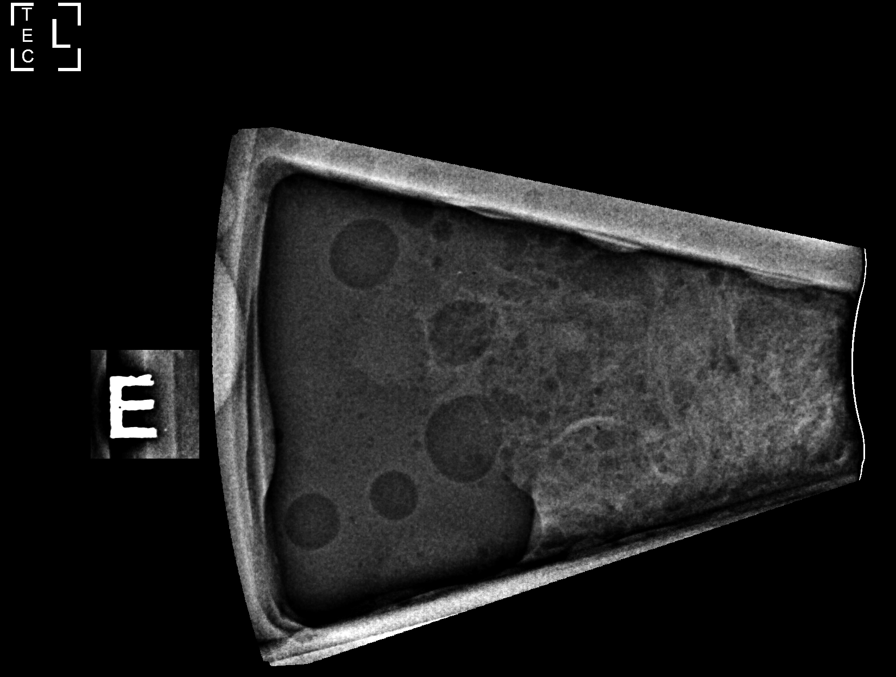

[L (3 of 8)]
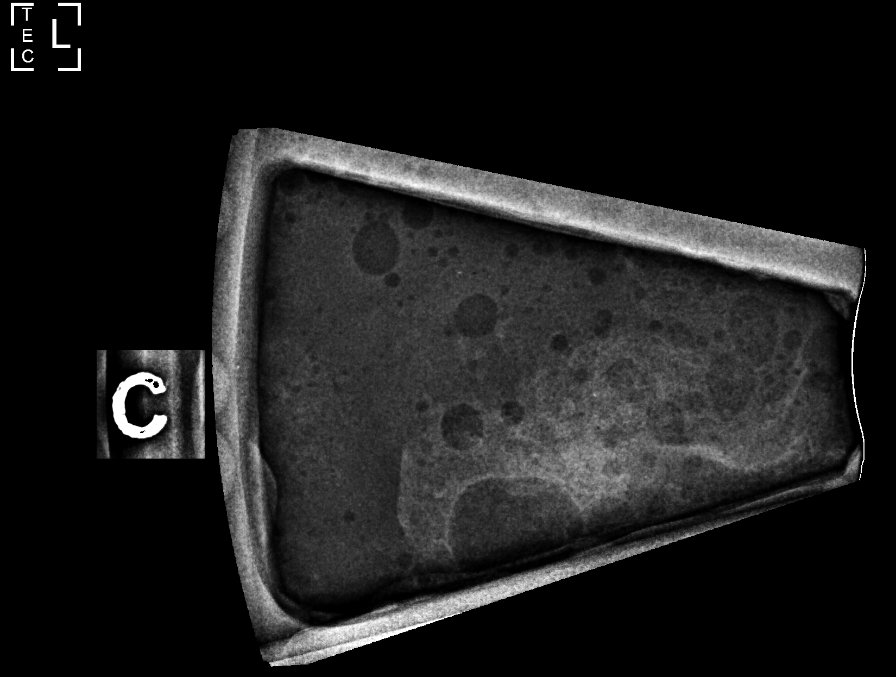

[L (4 of 8)]
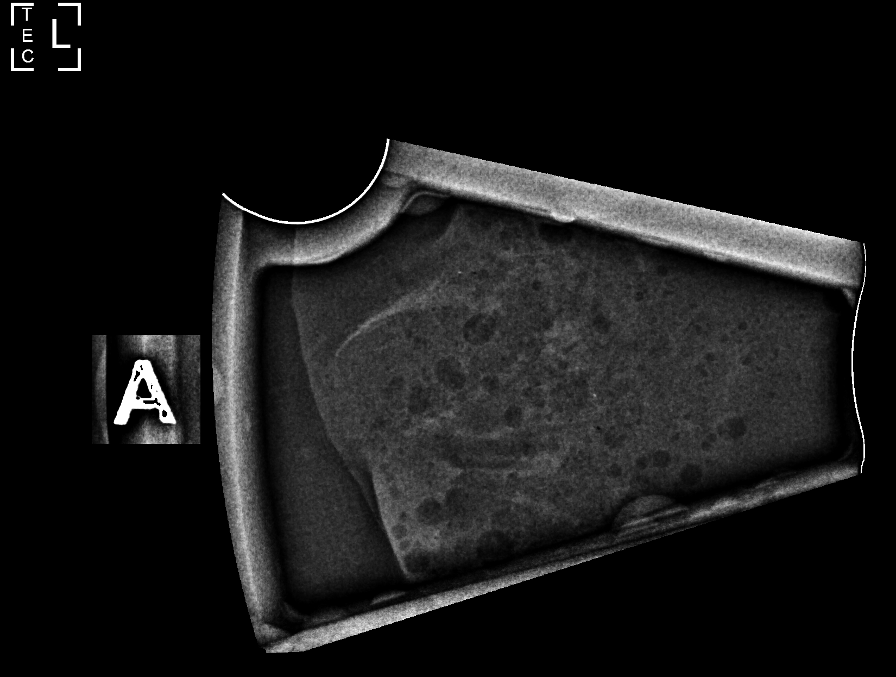

[L (5 of 8)]
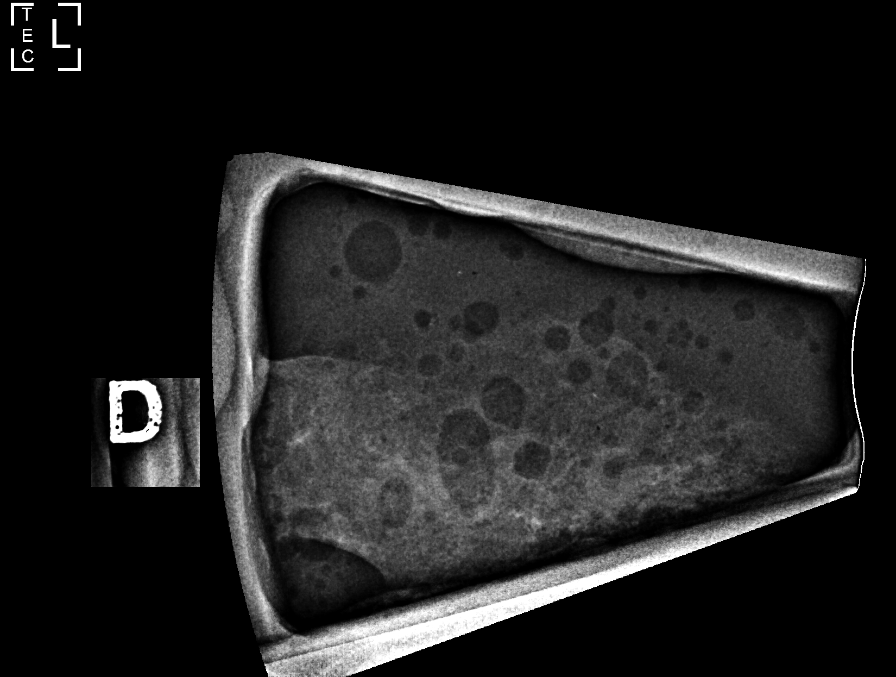

[L (6 of 8)]
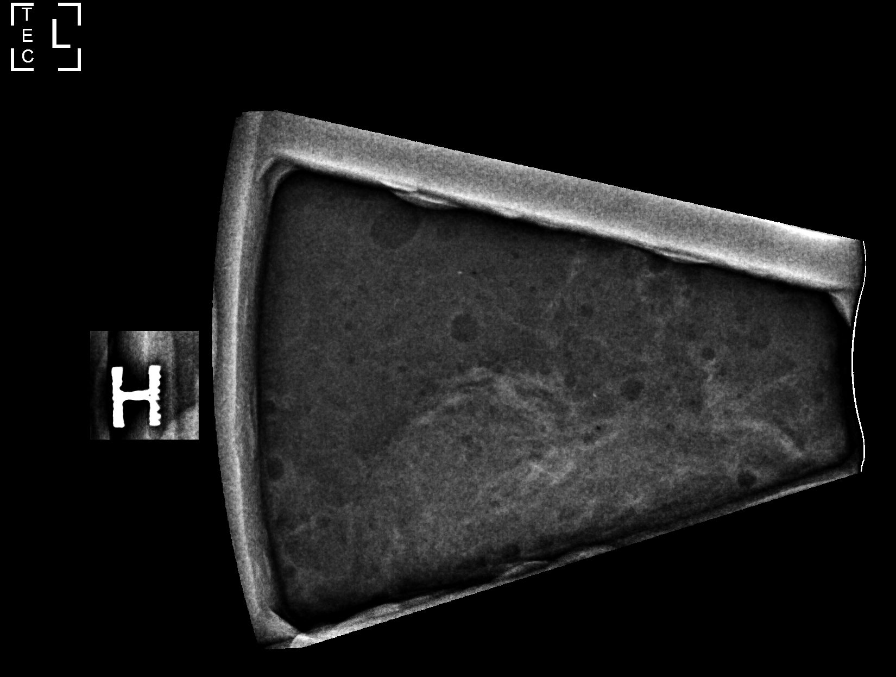

[L (7 of 8)]
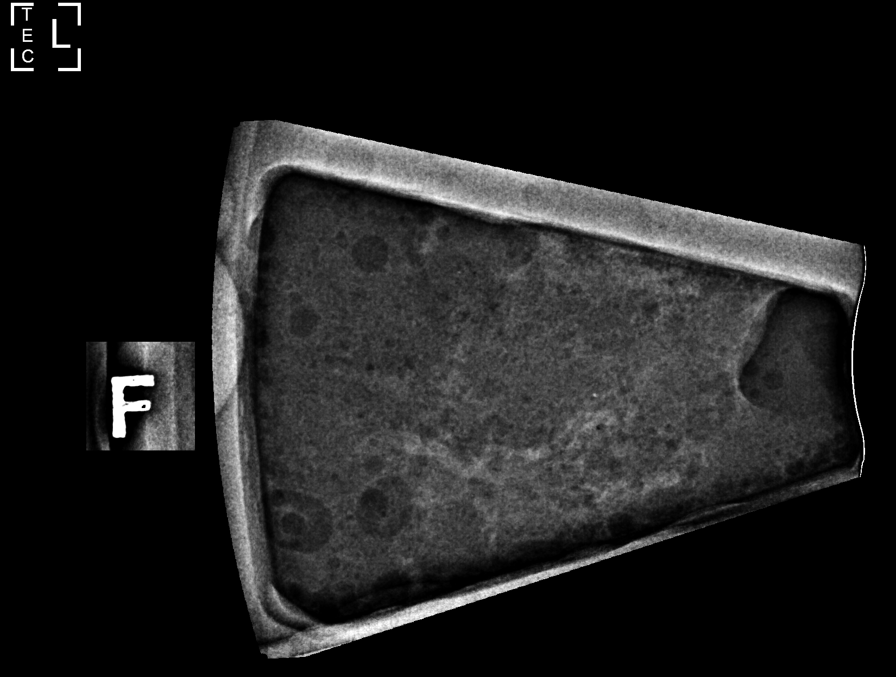

[L (8 of 8)]
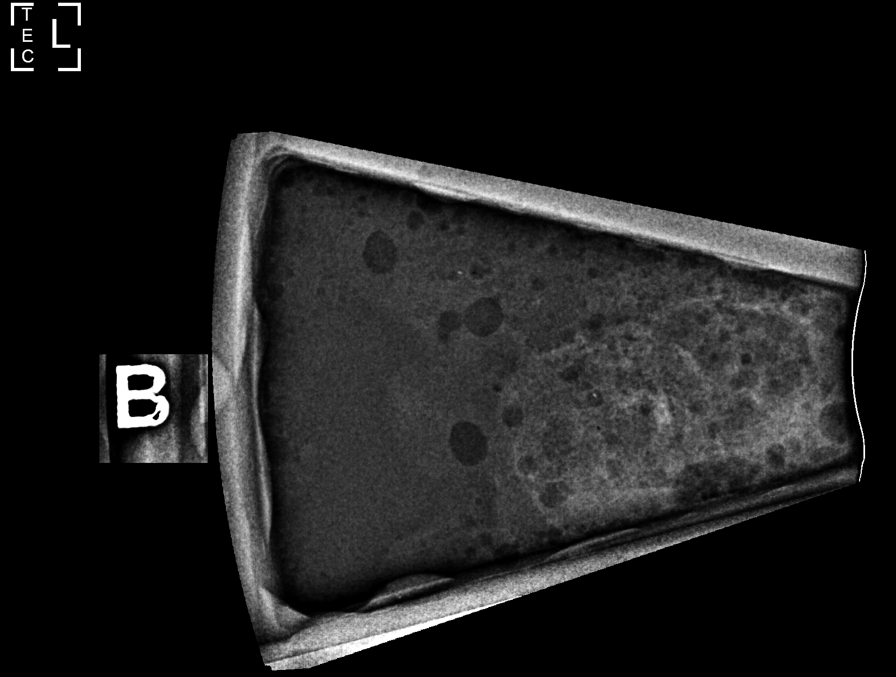

[8 of 29 positions shown; findings below may reference images not displayed]



Using sterile technique and 1% Lidocaine as local anesthetic, under
stereotactic guidance, a 9 gauge vacuum assisted device was used to
perform core needle biopsy of the recently demonstrated 7 mm mass in
the lower inner quadrant of the left breast using a medial approach.

Lesion quadrant: Lower inner quadrant

At the conclusion of the procedure, an X shaped tissue marker clip
was deployed into the biopsy cavity. Follow-up 2-view mammogram was
performed and dictated separately.
IMPRESSION: Stereotactic-guided biopsy of the recently demonstrated 7 mm mass in
the lower inner quadrant of the left breast. No apparent
complications.

ADDENDUM:
Pathology revealed GRADE I/II INVASIVE DUCTAL CARCINOMA, DUCTAL
CARCINOMA IN SITU of the LEFT breast, lower inner quadrant. This was
found to be concordant by Dr. Dleke Tiger.

Pathology results were discussed with the patient by telephone. The
patient reported doing well after the biopsy with tenderness at the
site. Post biopsy instructions and care were reviewed and questions
were answered. The patient was encouraged to call The [REDACTED]

Per patient request, surgical consultation has been arranged with
Dr. Gazsy Rehus at [facility][email or web address][facility][email or web address]]. Reports and
images faxed on May 11, 2020.

Pathology results reported by Jhemboy Padam RN on 05/11/2020.



Using sterile technique and 1% Lidocaine as local anesthetic, under
stereotactic guidance, a 9 gauge vacuum assisted device was used to
perform core needle biopsy of the recently demonstrated 7 mm mass in
the lower inner quadrant of the left breast using a medial approach.

Lesion quadrant: Lower inner quadrant

At the conclusion of the procedure, an X shaped tissue marker clip
was deployed into the biopsy cavity. Follow-up 2-view mammogram was
performed and dictated separately.
IMPRESSION: Stereotactic-guided biopsy of the recently demonstrated 7 mm mass in
the lower inner quadrant of the left breast. No apparent
complications.

## 2021-09-06 NOTE — Progress Notes (Signed)
[facility]  [address] [place],  [place]  [address] [phone]  Clinic Day:  09/12/2021  Referring physician: Helen Larson., MD  This document serves as a record of services personally performed by Ashley Poisson, MD. It was created on their behalf by Ashley Larson, a trained medical scribe. The creation of this record is based on the scribe's personal observations and the provider's statements to them.  ASSESSMENT & PLAN:   Assessment & Plan: 1.  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 She remains without evidence of recurrence.  She is currently on adjuvant hormonal therapy with anastrozole and tolerating this fairly well.   2.  History of melanoma of the left foot, diagnosed in 2008.  This was treated with wide excision in [place] and sentinel lymph node was negative.  She remains without evidence of recurrence.  She continues to follow with Dermatology.   3.  History of basal cell carcinoma of the nose, treated with excision.   4.  Osteopenia, for which she is onalendronate 70 mg weekly, in addition to calcium and vitamin D.  She will be due for bone density scan again in December 2023.   5.  History of vitamin D deficiency, she continues vitamin-D.   6.  Irregular dark nevus in the right axillary line.  She continues to have skin checks through her dermatologist on a regular basis.    She knows to continue anastrozole daily.  She knows to continue alendronate as prescribed, as well as calcium /vitamin-D.  We will plan to see her back in 4 months for repeat examination. Following that appointment, we can go to 6 month follow up. She will be due for repeat bone density in December 2023. The patient understands the plans discussed today and is in agreement with them.  She knows to contact our office if she develops concerns prior to her next  appointment.  I provided 15 minutes of face-to-face time during this this encounter and > 50% was spent counseling as documented under my assessment and plan.    Ashley Larson [address] [place] [address] Dept: [phone] Dept Fax: [phone]   No orders of the defined types were placed in this encounter.      CHIEF COMPLAINT:  CC: Stage IA hormone receptor positive breast cancer  Current Treatment:  Anastrozole 1 mg daily   HISTORY OF PRESENT ILLNESS:  Ashley Larson is a 76 y.o. 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 This was found on routine screening mammogram in August 2021.  Diagnostic left mammogram confirmed a lobulated hypoechoic mass in the left breast at 10 oclock 2 cm from the nipple measuring 5.0 x 4.0 x 5.0 mm.  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  Estrogen receptor was positive at 100% and progesterone receptors were positive at 95%  HER2 was negative.  Ki67 was 10%.  She was treated with lumpectomy and left axillary sentinel lymph node biopsy in October.  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  All margins were clear of neoplasm. One lymph node was negative for malignancy.  Adjuvant radiation therapy was not recommended.  She was started on adjuvant hormonal therapy with anastrozole 55m daily in December.  She has a history of osteoporosis of the spine, for which she has been on alendronate 770mweekly for several years.  Her last bone density scan was in 2017, so we scheduled her for a repeat. She has a history of melanoma of the left foot treated with surgical excision and skin grafting [place] lymph node [place]  Apparently, the lymph node was negative.  She also has a history of basal cell carcinoma of the nose. She follows with her dermatologist Dr. WiJimmye Normanegularly. She had an allergic reaction to chlorhexidine with surgery.  Bone density scan in December 2021 revealed stable to improved osteopenia with a T-score of -2.3 in the spine and a T-score of -1.7 in the femur, for which she continues alendronate and calcium/vitamin-D. Bilateral diagnostic mammogram in August 2022 did not reveal any evidence of disease.  INTERVAL HISTORY:  Ashley Larson here for routine follow up and states that she has been well. She does note trigger finger of the right middle finger. She also reports arthritis of the left knee. She continues anastrozole 1 mg daily without difficulty. Her  appetite is good, and her weight is stable since her last visit.  She denies fever, chills or other signs of infection.  She denies nausea, vomiting, bowel issues, or abdominal pain.  She denies sore throat, cough, dyspnea, or chest pain.  REVIEW OF SYSTEMS:  Review of Systems  Constitutional: Negative.  Negative for appetite change, chills, fatigue, fever and unexpected weight change.  HENT:  Negative.    Eyes: Negative.   Respiratory: Negative.  Negative for chest tightness, cough, hemoptysis, shortness of breath and wheezing.   Cardiovascular: Negative.  Negative for chest pain, leg swelling and palpitations.  Gastrointestinal: Negative.  Negative for abdominal distention, abdominal pain, blood in stool, constipation, diarrhea, nausea and vomiting.  Endocrine: Negative.   Genitourinary: Negative.  Negative for difficulty urinating, dysuria, frequency and hematuria.   Musculoskeletal:  Positive for arthralgias (left knee). Negative for back pain, flank pain, gait problem and myalgias.       Trigger finger of the right middle finger  Skin: Negative.   Neurological: Negative.  Negative for dizziness, extremity weakness, gait problem,  headaches, light-headedness, numbness, seizures and speech difficulty.  Hematological: Negative.   Psychiatric/Behavioral: Negative.  Negative for depression and sleep disturbance. The patient is not nervous/anxious.     VITALS:  Blood pressure (!) 173/75, pulse 68, temperature 97.7 F (36.5 C), temperature source Oral, resp. rate 17, height 5' (1.524 m), weight 164 lb 14.4 oz (74.8 kg), last menstrual period 07/31/2000, SpO2 98 %.  Wt Readings from Last 3 Encounters:  09/12/21 164 lb 14.4 oz (74.8 kg)  06/16/21 165 lb (74.8 kg)  03/16/21 165 lb 4.8 oz (75 kg)    Body mass index is 32.2 kg/m.  Performance status (ECOG): 1 - Symptomatic but completely ambulatory  PHYSICAL EXAM:  Physical Exam Constitutional:      General: She is not in acute distress.    Appearance: Normal appearance. She is normal weight.  HENT:     Head: Normocephalic and atraumatic.  Eyes:     General: No scleral icterus.    Extraocular Movements: Extraocular movements intact.     Conjunctiva/sclera: Conjunctivae normal.     Pupils: Pupils are equal, round, and reactive to light.  Cardiovascular:     Rate and Rhythm: Normal rate and  regular rhythm.     Pulses: Normal pulses.     Heart sounds: Normal heart sounds. No murmur heard.   No friction rub. No gallop.  Pulmonary:     Effort: Pulmonary effort is normal. No respiratory distress.     Breath sounds: Normal breath sounds.  Chest:     Comments: Well healed incision in the lower inner quadrant of the left breast, and left axilla. No masses in either breast. Abdominal:     General: Bowel sounds are normal. There is no distension.     Palpations: Abdomen is soft. There is no hepatomegaly, splenomegaly or mass.     Tenderness: There is no abdominal tenderness.  Musculoskeletal:        General: Normal range of motion.     Cervical back: Normal range of motion and neck supple.     Right lower leg: No edema.     Left lower leg: No edema.     Comments:  Crepitation of the bilateral knees  Lymphadenopathy:     Cervical: No cervical adenopathy.  Skin:    General: Skin is warm and dry.  Neurological:     General: No focal deficit present.     Mental Status: She is alert and oriented to person, place, and time. Mental status is at baseline.  Psychiatric:        Mood and Affect: Mood normal.        Behavior: Behavior normal.        Thought Content: Thought content normal.        Judgment: Judgment normal.    LABS:  No flowsheet data found. No flowsheet data found.    STUDIES:  No results found.    HISTORY:   Allergies:  Allergies  Allergen Reactions   Wound Dressing Adhesive    Chlorhexidine Rash    Red raised rash with surrounding redness     Current Medications: Current Outpatient Medications  Medication Sig Dispense Refill   alendronate (FOSAMAX) 70 MG tablet Take 70 mg by mouth once a week.     anastrozole (ARIMIDEX) 1 MG tablet Take 1 tablet (1 mg total) by mouth daily. 90 tablet 3   cholecalciferol (VITAMIN D3) 25 MCG (1000 UNIT) tablet Take 1,000 Units by mouth daily.     ezetimibe (ZETIA) 10 MG tablet Take 10 mg by mouth daily.     lisinopril-hydrochlorothiazide (ZESTORETIC) 20-12.5 MG tablet Take 1 tablet by mouth daily.     metoprolol succinate (TOPROL-XL) 25 MG 24 hr tablet Take 25 mg by mouth daily.     ondansetron (ZOFRAN-ODT) 8 MG disintegrating tablet Take 8 mg by mouth every 6 (six) hours.     simvastatin (ZOCOR) 40 MG tablet Take 40 mg by mouth daily.     No current facility-administered medications for this visit.     I, Rita Ohara, am acting as scribe for Derwood Kaplan, MD  I have reviewed this report as typed by the medical scribe, and it is complete and accurate.

## 2021-09-12 ENCOUNTER — Encounter: Payer: Self-pay | Admitting: Oncology

## 2021-09-12 ENCOUNTER — Telehealth: Payer: Self-pay | Admitting: Oncology

## 2021-09-12 ENCOUNTER — Inpatient Hospital Stay: Payer: Medicare Other | Attending: Hematology and Oncology | Admitting: Oncology

## 2021-09-12 ENCOUNTER — Other Ambulatory Visit: Payer: Self-pay

## 2021-09-12 VITALS — BP 173/75 | HR 68 | Temp 97.7°F | Resp 17 | Ht 60.0 in | Wt 164.9 lb

## 2021-09-12 DIAGNOSIS — C50212 Malignant neoplasm of upper-inner quadrant of left female breast: Secondary | ICD-10-CM

## 2021-09-12 DIAGNOSIS — Z780 Asymptomatic menopausal state: Secondary | ICD-10-CM

## 2021-09-12 DIAGNOSIS — M8580 Other specified disorders of bone density and structure, unspecified site: Secondary | ICD-10-CM

## 2021-09-12 DIAGNOSIS — Z170 Estrogen receptor positive status [ER+]: Secondary | ICD-10-CM

## 2021-09-12 NOTE — Telephone Encounter (Signed)
Patient has been scheduled for follow-up visit per 09/12/21 los. Pt given an appt calendar with date and time.

## 2021-09-14 DIAGNOSIS — Z853 Personal history of malignant neoplasm of breast: Secondary | ICD-10-CM | POA: Diagnosis not present

## 2021-09-14 DIAGNOSIS — M8580 Other specified disorders of bone density and structure, unspecified site: Secondary | ICD-10-CM | POA: Diagnosis not present

## 2021-09-14 DIAGNOSIS — Z79899 Other long term (current) drug therapy: Secondary | ICD-10-CM | POA: Diagnosis not present

## 2021-09-14 DIAGNOSIS — E782 Mixed hyperlipidemia: Secondary | ICD-10-CM | POA: Diagnosis not present

## 2021-09-14 DIAGNOSIS — E559 Vitamin D deficiency, unspecified: Secondary | ICD-10-CM | POA: Diagnosis not present

## 2021-09-14 DIAGNOSIS — Z139 Encounter for screening, unspecified: Secondary | ICD-10-CM | POA: Diagnosis not present

## 2021-09-14 DIAGNOSIS — Z6830 Body mass index (BMI) 30.0-30.9, adult: Secondary | ICD-10-CM | POA: Diagnosis not present

## 2021-09-14 DIAGNOSIS — I10 Essential (primary) hypertension: Secondary | ICD-10-CM | POA: Diagnosis not present

## 2021-09-20 ENCOUNTER — Ambulatory Visit: Payer: Medicare Other | Admitting: Oncology

## 2021-11-22 DIAGNOSIS — E669 Obesity, unspecified: Secondary | ICD-10-CM | POA: Diagnosis not present

## 2021-11-22 DIAGNOSIS — Z1331 Encounter for screening for depression: Secondary | ICD-10-CM | POA: Diagnosis not present

## 2021-11-22 DIAGNOSIS — Z9181 History of falling: Secondary | ICD-10-CM | POA: Diagnosis not present

## 2021-11-22 DIAGNOSIS — Z0000 Encounter for general adult medical examination without abnormal findings: Secondary | ICD-10-CM | POA: Diagnosis not present

## 2021-11-22 DIAGNOSIS — E785 Hyperlipidemia, unspecified: Secondary | ICD-10-CM | POA: Diagnosis not present

## 2021-11-22 DIAGNOSIS — Z6830 Body mass index (BMI) 30.0-30.9, adult: Secondary | ICD-10-CM | POA: Diagnosis not present

## 2021-11-30 ENCOUNTER — Other Ambulatory Visit: Payer: Self-pay | Admitting: Hematology and Oncology

## 2021-11-30 DIAGNOSIS — C50212 Malignant neoplasm of upper-inner quadrant of left female breast: Secondary | ICD-10-CM

## 2022-01-09 NOTE — Assessment & Plan Note (Signed)
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  She was treated with lumpectomy and placed on adjuvant hormonal therapy with anastrozole 1 mg daily in December 2021. She remains without evidence of recurrence. She knows to continue anastrozole daily.

## 2022-01-09 NOTE — Assessment & Plan Note (Addendum)
Osteopenia, for which she continues alendronate 70 mg weekly, in addition to calcium in the form of Tums, as well as vitamin D. She will be due for bone density scan in December 2023.

## 2022-01-09 NOTE — Progress Notes (Signed)
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  She was treated with lumpectomy and placed on adjuvant hormonal therapy with anastrozole 1 mg daily in December 2021. She remains without evidence of recurrence.  She knows to continue anastrozole daily.  Osteopenia after menopause Osteopenia, for which she continues alendronate 70 mg weekly, in addition to calcium in the form of Tums, as well as vitamin D.  She will be due for bone density scan in December 2023.    She is seeing Dr. Lilia Pro with a mammogram in September.  We will see her back in 6 months with a bone density scan.  The patient understands the plans discussed today and is in agreement with them.  She knows to contact our office if she develops concerns prior to her next appointment.   I provided 15 minutes of face-to-face time during this encounter and > 50% was spent counseling as documented under my assessment and plan.    Marvia Pickles, PA-C  [facility] [address] [place] [address] Dept: [phone] Dept Fax: [phone]   Orders Placed This Encounter  Procedures   DG Bone Density    Standing Status:   Future    Standing Expiration Date:   01/11/2023    Scheduling Instructions:     RH    Order Specific Question:   Reason for Exam (SYMPTOM  OR DIAGNOSIS REQUIRED)    Answer:   osteopenia    Order Specific Question:   Preferred imaging location?    Answer:   External      CHIEF COMPLAINT:  CC: Stage IA hormone receptor  positive breast cancer  Current Treatment: Anastrozole 1 gm daily  HISTORY OF PRESENT ILLNESS:  Ashley Larson is a 76 y.o. 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 This was found on routine screening mammogram in August 2021.  Diagnostic left mammogram confirmed a lobulated hypoechoic mass in the left breast at 10 o'clock 2 cm from the nipple measuring 5.0 x 4.0 x 5.0 mm.  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  Estrogen receptor was positive at 100% and progesterone receptors were positive at 95%  HER2 was negative.  Ki67 was 10%.  She was treated with lumpectomy and left axillary sentinel lymph node biopsy in October.  Final pathology revealed a 0.4 cm, grade 1, invasive ductal carcinoma with ductal carcinoma in situ adjacent to the biopsy site.  All margins were clear of neoplasm. One lymph node was negative for malignancy.  Adjuvant chemotherapy and radiation therapy were not recommended.  She was started on adjuvant hormonal therapy with anastrozole 1 mg daily in December 2021.  She has a history of osteoporosis of the spine, for which she has been on alendronate 70 mg weekly for several years.   Bone density scan in December 2021 revealed stable to improved bone density with a T-score of -2.3 in the spine and a T-score of -1.7 in the femur, for which she continues  alendronate and calcium/vitamin-D.  Bilateral diagnostic mammogram in August 2022 did not reveal any evidence of disease.  She also has a history of melanoma of the left foot treated with surgical excision and skin grafting with sentinel lymph node biopsy several years ago in [place].  Apparently, the lymph node was negative.  She has a history of basal cell carcinoma of the nose, as well. She follows with her dermatologist Dr. Jimmye Norman regularly. She had an allergic reaction to chlorhexidine with surgery.   Oncology History   Malignant neoplasm of upper-inner quadrant of left breast in female, estrogen receptor positive ([place])  05/10/2020 Initial Diagnosis   Breast cancer, left ([place])   05/17/2020 Cancer Staging   Staging form: Breast, AJCC 8th Edition - Clinical stage from 05/17/2020: Stage IA (cT1a, cN0(sn), cM0, G1, ER+, PR+, HER2-) - Signed by Derwood Kaplan, MD on 06/13/2020   Melanoma of skin ([place])  10/12/2006 Initial Diagnosis   Melanoma of skin ([place])       INTERVAL HISTORY:  Ashley Larson is here today for repeat clinical assessment.  She states she continues anastrozole daily without difficulty.  She reports occasional hot flashes, which are not severe.  She reports chronic knee pain, which is stable.  She otherwise denies myalgias or arthralgias.  She denies vaginal discharge or bleeding.  She denies any changes in her breasts.  She denies any concerning skin lesions.  She denies fevers or chills.  Her appetite is good. Her weight has been stable.  She continues alendronate weekly in addition to Tums and vitamin D daily.  She continues to follow-up with Dr. Lilia Pro is scheduled to see him after her mammogram in September.  She continues to see the dermatologist at latest yearly.  She was concerned as to whether her appointment today would be covered by her insurance and states she may have to change providers due to her insurance.  I believe this is due to an issue with [employer] that we have been aware of and are working on, even though her insurance is out [place].  REVIEW OF SYSTEMS:  Review of Systems  Constitutional:  Negative for appetite change, chills, fatigue, fever and unexpected weight change.  HENT:   Negative for lump/mass, mouth sores and sore throat.   Respiratory:  Negative for cough and shortness of breath.   Cardiovascular:  Negative for chest pain and leg swelling.  Gastrointestinal:  Negative for abdominal pain, constipation, diarrhea, nausea and vomiting.  Endocrine:  Negative for hot flashes.  Genitourinary:  Negative for difficulty urinating, dysuria, frequency and hematuria.   Musculoskeletal:  Negative for arthralgias, back pain and myalgias.  Skin:  Negative for rash.  Neurological:  Negative for dizziness and headaches.  Hematological:  Negative for adenopathy. Does not bruise/bleed easily.  Psychiatric/Behavioral:  Negative for depression and sleep disturbance. The patient is not nervous/anxious.      VITALS:  Blood pressure (!) 174/78, pulse 68, temperature 97.7 F (36.5 C), temperature source Oral, resp. rate 18, height 5' (1.524 m), weight 164 lb 4.8 oz (74.5 kg), last menstrual period 07/31/2000, SpO2 96 %.  Wt Readings from Last 3 Encounters:  01/10/22 164 lb 4.8 oz (74.5 kg)  09/12/21 164 lb 14.4 oz (74.8 kg)  06/16/21 165 lb (74.8 kg)    Body mass index is 32.09 kg/m.  Performance status (ECOG): 0 - Asymptomatic  PHYSICAL EXAM:  Physical Exam Vitals and nursing note reviewed.  Constitutional:      General: She is not  in acute distress.    Appearance: Normal appearance.  HENT:     Head: Normocephalic and atraumatic.     Mouth/Throat:     Mouth: Mucous membranes are moist.     Pharynx: Oropharynx is clear. No oropharyngeal exudate or posterior oropharyngeal erythema.  Eyes:     General: No scleral icterus.    Extraocular Movements: Extraocular movements intact.     Conjunctiva/sclera: Conjunctivae normal.     Pupils: Pupils are equal, round, and reactive to light.  Cardiovascular:     Rate and Rhythm: Normal rate and regular rhythm.     Heart sounds: Normal heart sounds. No murmur heard.    No friction rub. No gallop.  Pulmonary:     Effort: Pulmonary effort is normal.     Breath sounds: Normal breath sounds. No wheezing, rhonchi or rales.  Chest:  Breasts:    Right: Normal. No swelling, bleeding, inverted nipple, mass, nipple discharge, skin change or tenderness.     Left: Normal. No swelling, bleeding, inverted nipple,  mass, nipple discharge, skin change or tenderness.  Abdominal:     General: There is no distension.     Palpations: Abdomen is soft. There is no hepatomegaly, splenomegaly or mass.     Tenderness: There is no abdominal tenderness.  Musculoskeletal:        General: Normal range of motion.     Cervical back: Normal range of motion and neck supple. No tenderness.     Right lower leg: No edema.     Left lower leg: No edema.  Lymphadenopathy:     Cervical: No cervical adenopathy.     Upper Body:     Right upper body: No supraclavicular or axillary adenopathy.     Left upper body: No supraclavicular or axillary adenopathy.     Lower Body: No right inguinal adenopathy. No left inguinal adenopathy.  Skin:    General: Skin is warm and dry.     Coloration: Skin is not jaundiced.     Findings: No rash.  Neurological:     Mental Status: She is alert and oriented to person, place, and time.     Cranial Nerves: No cranial nerve deficit.  Psychiatric:        Mood and Affect: Mood normal.        Behavior: Behavior normal.        Thought Content: Thought content normal.     LABS:       No data to display             No data to display           No results found for: "CEA1", "CEA" / No results found for: "CEA1", "CEA" No results found for: "PSA1" No results found for: "TWS568" No results found for: "CAN125"  No results found for: "TOTALPROTELP", "ALBUMINELP", "A1GS", "A2GS", "BETS", "BETA2SER", "GAMS", "MSPIKE", "SPEI" No results found for: "TIBC", "FERRITIN", "IRONPCTSAT" No results found for: "LDH"  STUDIES:  No results found.    HISTORY:   Past Medical History:  Diagnosis Date   Allergy    Arthritis    Breast cancer ([place])    Hyperlipidemia    Hypertension    Melanoma of foot, left ([place]) 2008   treated with excision   Osteopenia    Skin cancer    History of basal cell carcinoma   Vitamin D deficiency     Past Surgical History:  Procedure Laterality Date    APPENDECTOMY  BASAL CELL CARCINOMA EXCISION  2014   SCALP   BREAST LUMPECTOMY WITH AXILLARY LYMPH NODE BIOPSY Left 05/17/2020   MELANOMA EXCISION WITH SENTINEL LYMPH NODE BIOPSY Left 2008   FOOT   TONSILLECTOMY  1954    Family History  Problem Relation Age of Onset   Prostate cancer Father        in his 43s   Pancreatic cancer Maternal Aunt        in her 53s    Social History:  reports that she quit smoking about 61 years ago. Her smoking use included cigarettes. She started smoking about 63 years ago. She has a 1.00 pack-year smoking history. She has never used smokeless tobacco. She reports current alcohol use of about 1.0 standard drink of alcohol per week. She reports that she does not use drugs.The patient is alone today.  Allergies:  Allergies  Allergen Reactions   Wound Dressing Adhesive    Chlorhexidine Rash    Red raised rash with surrounding redness     Current Medications: Current Outpatient Medications  Medication Sig Dispense Refill   alendronate (FOSAMAX) 70 MG tablet Take 70 mg by mouth once a week.     anastrozole (ARIMIDEX) 1 MG tablet TAKE ONE TABLET BY MOUTH EVERY DAY 90 tablet 3   cholecalciferol (VITAMIN D3) 25 MCG (1000 UNIT) tablet Take 1,000 Units by mouth daily.     ezetimibe (ZETIA) 10 MG tablet Take 10 mg by mouth daily.     lisinopril-hydrochlorothiazide (ZESTORETIC) 20-12.5 MG tablet Take 1 tablet by mouth daily.     metoprolol succinate (TOPROL-XL) 25 MG 24 hr tablet Take 25 mg by mouth daily.     simvastatin (ZOCOR) 40 MG tablet Take 40 mg by mouth daily.     No current facility-administered medications for this visit.

## 2022-01-10 ENCOUNTER — Inpatient Hospital Stay: Payer: Medicare Other | Attending: Hematology and Oncology | Admitting: Hematology and Oncology

## 2022-01-10 ENCOUNTER — Encounter: Payer: Self-pay | Admitting: Hematology and Oncology

## 2022-01-10 DIAGNOSIS — C50212 Malignant neoplasm of upper-inner quadrant of left female breast: Secondary | ICD-10-CM | POA: Diagnosis not present

## 2022-01-10 DIAGNOSIS — Z170 Estrogen receptor positive status [ER+]: Secondary | ICD-10-CM | POA: Diagnosis not present

## 2022-01-10 DIAGNOSIS — M8580 Other specified disorders of bone density and structure, unspecified site: Secondary | ICD-10-CM

## 2022-01-10 DIAGNOSIS — Z780 Asymptomatic menopausal state: Secondary | ICD-10-CM

## 2022-01-12 DIAGNOSIS — Z853 Personal history of malignant neoplasm of breast: Secondary | ICD-10-CM | POA: Diagnosis not present

## 2022-01-12 DIAGNOSIS — E782 Mixed hyperlipidemia: Secondary | ICD-10-CM | POA: Diagnosis not present

## 2022-01-12 DIAGNOSIS — Z139 Encounter for screening, unspecified: Secondary | ICD-10-CM | POA: Diagnosis not present

## 2022-01-12 DIAGNOSIS — Z6831 Body mass index (BMI) 31.0-31.9, adult: Secondary | ICD-10-CM | POA: Diagnosis not present

## 2022-01-12 DIAGNOSIS — M8580 Other specified disorders of bone density and structure, unspecified site: Secondary | ICD-10-CM | POA: Diagnosis not present

## 2022-01-12 DIAGNOSIS — G5601 Carpal tunnel syndrome, right upper limb: Secondary | ICD-10-CM | POA: Diagnosis not present

## 2022-01-12 DIAGNOSIS — I10 Essential (primary) hypertension: Secondary | ICD-10-CM | POA: Diagnosis not present

## 2022-01-12 DIAGNOSIS — E559 Vitamin D deficiency, unspecified: Secondary | ICD-10-CM | POA: Diagnosis not present

## 2022-01-19 DIAGNOSIS — H353131 Nonexudative age-related macular degeneration, bilateral, early dry stage: Secondary | ICD-10-CM | POA: Diagnosis not present

## 2022-01-19 DIAGNOSIS — H524 Presbyopia: Secondary | ICD-10-CM | POA: Diagnosis not present

## 2022-01-19 DIAGNOSIS — H25813 Combined forms of age-related cataract, bilateral: Secondary | ICD-10-CM | POA: Diagnosis not present

## 2022-01-19 DIAGNOSIS — H5203 Hypermetropia, bilateral: Secondary | ICD-10-CM | POA: Diagnosis not present

## 2022-01-19 DIAGNOSIS — I10 Essential (primary) hypertension: Secondary | ICD-10-CM | POA: Diagnosis not present

## 2022-01-19 DIAGNOSIS — H52223 Regular astigmatism, bilateral: Secondary | ICD-10-CM | POA: Diagnosis not present

## 2022-01-19 DIAGNOSIS — H35373 Puckering of macula, bilateral: Secondary | ICD-10-CM | POA: Diagnosis not present

## 2022-04-05 DIAGNOSIS — C50312 Malignant neoplasm of lower-inner quadrant of left female breast: Secondary | ICD-10-CM | POA: Diagnosis not present

## 2022-04-05 DIAGNOSIS — R928 Other abnormal and inconclusive findings on diagnostic imaging of breast: Secondary | ICD-10-CM | POA: Diagnosis not present

## 2022-04-11 DIAGNOSIS — C50312 Malignant neoplasm of lower-inner quadrant of left female breast: Secondary | ICD-10-CM | POA: Diagnosis not present

## 2022-05-24 DIAGNOSIS — I10 Essential (primary) hypertension: Secondary | ICD-10-CM | POA: Diagnosis not present

## 2022-05-24 DIAGNOSIS — Z6830 Body mass index (BMI) 30.0-30.9, adult: Secondary | ICD-10-CM | POA: Diagnosis not present

## 2022-05-24 DIAGNOSIS — E782 Mixed hyperlipidemia: Secondary | ICD-10-CM | POA: Diagnosis not present

## 2022-05-24 DIAGNOSIS — E559 Vitamin D deficiency, unspecified: Secondary | ICD-10-CM | POA: Diagnosis not present

## 2022-05-24 DIAGNOSIS — Z23 Encounter for immunization: Secondary | ICD-10-CM | POA: Diagnosis not present

## 2022-07-05 DIAGNOSIS — M8589 Other specified disorders of bone density and structure, multiple sites: Secondary | ICD-10-CM | POA: Diagnosis not present

## 2022-07-11 DIAGNOSIS — Z85820 Personal history of malignant melanoma of skin: Secondary | ICD-10-CM | POA: Diagnosis not present

## 2022-07-11 DIAGNOSIS — L821 Other seborrheic keratosis: Secondary | ICD-10-CM | POA: Diagnosis not present

## 2022-07-11 DIAGNOSIS — D225 Melanocytic nevi of trunk: Secondary | ICD-10-CM | POA: Diagnosis not present

## 2022-07-11 DIAGNOSIS — C44519 Basal cell carcinoma of skin of other part of trunk: Secondary | ICD-10-CM | POA: Diagnosis not present

## 2022-07-11 DIAGNOSIS — L814 Other melanin hyperpigmentation: Secondary | ICD-10-CM | POA: Diagnosis not present

## 2022-07-11 DIAGNOSIS — C44712 Basal cell carcinoma of skin of right lower limb, including hip: Secondary | ICD-10-CM | POA: Diagnosis not present

## 2022-07-11 NOTE — Progress Notes (Signed)
[facility] [place]  [address] [place],  [place]  [address] [phone]  Clinic Day:  07/12/22   Referring physician: Helen Hashimoto., MD  ASSESSMENT & PLAN:   Assessment & Plan: 1.  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 She remains without evidence of recurrence.  She is currently on adjuvant hormonal therapy with anastrozole and tolerating this fairly well.   2.  History of melanoma of the left foot, diagnosed in 2008.  This was treated with wide excision in [place] and sentinel lymph node was negative.  She remains without evidence of recurrence.  She continues to follow with Dermatology.   3.  History of basal cell carcinoma of the nose, treated with excision. She has recently had 2 more excised of her right lower leg and right posterior shoulder. Pathology is pending.   4.  Osteopenia, for which she is on alendronate 70 mg weekly, in addition to calcium and vitamin D.  She had a DEXA 07/05/22 which is stable with just slight worsening of the femur to a T score of -1.9 and spine is stable at a T score of -2.3.       Plan She knows to continue Anastrozole daily.  She knows to continue Alendronate as prescribed, as well as calcium /vitamin-D.  We will plan to see her back in 6 months for repeat examination in June, 2024. The patient understands the plans discussed today and is in agreement with them.  She knows to contact our office if she develops concerns prior to her next appointment.   I provided 15 minutes of face-to-face time during this this encounter and > 50% was spent counseling as documented under my assessment and plan.    Derwood Kaplan, MD  [place] [address] [place] [address] Dept: [phone] Dept Fax: [phone]   No orders of the defined types  were placed in this encounter.      CHIEF COMPLAINT:  CC: Stage IA hormone receptor positive breast cancer  Current Treatment:  Anastrozole 1 mg daily   HISTORY OF PRESENT ILLNESS:  Ashley Larson is a 76 y.o. 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 This was found on routine screening mammogram in August 2021.  Diagnostic left mammogram confirmed a lobulated hypoechoic mass in the left breast at 10 o'clock 2 cm from the nipple measuring 5.0 x 4.0 x 5.0 mm.  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  Estrogen receptor was positive at 100% and progesterone receptors were positive at 95%  HER2 was negative.  Ki67 was 10%.  She was treated with lumpectomy and left axillary sentinel lymph node biopsy in October.  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  All margins were clear of neoplasm. One lymph node was negative for malignancy.  Adjuvant radiation therapy was not recommended.  She was started on adjuvant hormonal therapy with anastrozole 28m daily in December.  She has a history of osteoporosis of the spine, for which she has been on alendronate 733mweekly for several years.  Her last bone density scan was in 2017, so we scheduled her for a repeat. She has a history of  melanoma of the left foot treated with surgical excision and skin grafting with sentinel lymph node biopsy several years ago in [place].  Apparently, the lymph node was negative.  She also has a history of basal cell carcinoma of the nose. She follows with her dermatologist Dr. Jimmye Norman regularly. She had an allergic reaction to chlorhexidine with surgery.  Bone density scan in December 2021 revealed stable to improved osteopenia with a T-score of -2.3 in the spine and a T-score of -1.7 in the femur, for which she  continues alendronate and calcium/vitamin-D. Bilateral diagnostic mammogram in August 2022 did not reveal any evidence of disease.  INTERVAL HISTORY:  Ashley Larson is here for routine follow up for  Stage IA hormone receptor positive breast cancer. She states that she is feeling well. She had resection of a skin cancer on her right lower leg and her right posterior shoulder. These were suspicious for basal cell carcinoma but final pathology is pending. 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 Her bone density scan is stable compared to her last scan and will be repeated in 2 years. She continues anastrozole 1 mg daily without difficulty. She denies signs of infection such as sore throat, sinus drainage, cough, or urinary symptoms.  She denies fevers or recurrent chills. She denies pain. She denies nausea, vomiting, chest pain, dyspnea or cough. Her weight has decreased 6 pounds over last 6 months .    REVIEW OF SYSTEMS:  Review of Systems  Constitutional: Negative.  Negative for appetite change, chills, fatigue, fever and unexpected weight change.  HENT:  Negative.    Eyes: Negative.   Respiratory: Negative.  Negative for chest tightness, cough, hemoptysis, shortness of breath and wheezing.   Cardiovascular: Negative.  Negative for chest pain, leg swelling and palpitations.  Gastrointestinal: Negative.  Negative for abdominal distention, abdominal pain, blood in stool, constipation, diarrhea, nausea and vomiting.  Endocrine: Negative.   Genitourinary: Negative.  Negative for difficulty urinating, dysuria, frequency and hematuria.   Musculoskeletal:  Positive for arthralgias (left knee). Negative for back pain, flank pain, gait problem and myalgias.  Skin: Negative.   Neurological: Negative.  Negative for dizziness, extremity weakness, gait problem, headaches, light-headedness, numbness, seizures and speech difficulty.  Hematological: Negative.   Psychiatric/Behavioral: Negative.  Negative for  depression and sleep disturbance. The patient is not nervous/anxious.      VITALS:  Blood pressure (!) 140/72, pulse 65, temperature 98 F (36.7 C), temperature source Oral, resp. rate 18, height 5' (1.524 m), weight 160 lb 1.6 oz (72.6 kg), last menstrual period 07/31/2000, SpO2 100 %.  Wt Readings from Last 3 Encounters:  07/12/22 160 lb 1.6 oz (72.6 kg)  01/10/22 164 lb 4.8 oz (74.5 kg)  09/12/21 164 lb 14.4 oz (74.8 kg)    Body mass index is 31.27 kg/m.  Performance status (ECOG): 1 - Symptomatic but completely ambulatory  PHYSICAL EXAM:  Physical Exam Constitutional:      General: She is not in acute distress.    Appearance: Normal appearance. She is normal weight.  HENT:     Head: Normocephalic and atraumatic.  Eyes:     General: No scleral icterus.    Extraocular Movements: Extraocular movements intact.     Conjunctiva/sclera: Conjunctivae normal.     Pupils: Pupils are equal, round, and reactive to light.  Cardiovascular:     Rate and Rhythm: Normal rate and regular rhythm.     Pulses: Normal pulses.     Heart sounds: Normal heart  sounds. No murmur heard.    No friction rub. No gallop.  Pulmonary:     Effort: Pulmonary effort is normal. No respiratory distress.     Breath sounds: Normal breath sounds.  Chest:     Comments: Left breast has well healed scar in the lower inner quadrant No mass in either breast Left axillary scar is well healed. Abdominal:     General: Bowel sounds are normal. There is no distension.     Palpations: Abdomen is soft. There is no hepatomegaly, splenomegaly or mass.     Tenderness: There is no abdominal tenderness.  Musculoskeletal:        General: Normal range of motion.     Cervical back: Normal range of motion and neck supple.     Right lower leg: No edema.     Left lower leg: No edema.  Lymphadenopathy:     Cervical: No cervical adenopathy.     Right cervical: No superficial, deep or posterior cervical adenopathy.    Left  cervical: No superficial, deep or posterior cervical adenopathy.     Upper Body:     Right upper body: No supraclavicular, axillary or pectoral adenopathy.     Left upper body: No supraclavicular, axillary or pectoral adenopathy.  Skin:    General: Skin is warm and dry.  Neurological:     General: No focal deficit present.     Mental Status: She is alert and oriented to person, place, and time. Mental status is at baseline.  Psychiatric:        Mood and Affect: Mood normal.        Behavior: Behavior normal.        Thought Content: Thought content normal.        Judgment: Judgment normal.     LABS:       No data to display             No data to display            STUDIES:  No results found.  Exam Dual X-Ray Absorptiometry (DXA) for Bone Density Impression: This patient is considered osteopenic according to [employer] criteria. AP Spine L1-L2 07/05/22:   74.8 osteopenia -2.3 0  .891g/cm2  DualFemur Neck Left 07/05/22:  74.8 Osteopenia -1.9 0  .774 g/cm2 DualFemur Total Mean 07/05/22: 74.8 Normal -0.9   0.892 g/cm2  HISTORY:   Allergies:  Allergies  Allergen Reactions   Wound Dressing Adhesive    Chlorhexidine Rash    Red raised rash with surrounding redness     Current Medications: Current Outpatient Medications  Medication Sig Dispense Refill   alendronate (FOSAMAX) 70 MG tablet Take 70 mg by mouth once a week.     anastrozole (ARIMIDEX) 1 MG tablet TAKE ONE TABLET BY MOUTH EVERY DAY 90 tablet 3   ezetimibe (ZETIA) 10 MG tablet Take 10 mg by mouth daily.     lisinopril-hydrochlorothiazide (ZESTORETIC) 20-12.5 MG tablet Take 1 tablet by mouth daily.     metoprolol succinate (TOPROL-XL) 25 MG 24 hr tablet Take 25 mg by mouth daily.     simvastatin (ZOCOR) 40 MG tablet Take 40 mg by mouth daily.     No current facility-administered medications for this visit.     I,Jasmine M Lassiter,acting as a scribe for Derwood Kaplan, MD.,have  documented all relevant documentation on the behalf of Derwood Kaplan, MD,as directed by  Derwood Kaplan, MD while in the presence of Chinita Pester  Hinton Rao, MD.

## 2022-07-12 ENCOUNTER — Telehealth: Payer: Self-pay | Admitting: *Deleted

## 2022-07-12 ENCOUNTER — Encounter: Payer: Self-pay | Admitting: Oncology

## 2022-07-12 ENCOUNTER — Inpatient Hospital Stay: Payer: Medicare Other | Attending: Oncology | Admitting: Oncology

## 2022-07-12 VITALS — BP 140/72 | HR 65 | Temp 98.0°F | Resp 18 | Ht 60.0 in | Wt 160.1 lb

## 2022-07-12 DIAGNOSIS — C439 Malignant melanoma of skin, unspecified: Secondary | ICD-10-CM

## 2022-07-12 DIAGNOSIS — C50212 Malignant neoplasm of upper-inner quadrant of left female breast: Secondary | ICD-10-CM | POA: Diagnosis not present

## 2022-07-12 DIAGNOSIS — Z170 Estrogen receptor positive status [ER+]: Secondary | ICD-10-CM | POA: Diagnosis not present

## 2022-07-12 DIAGNOSIS — Z780 Asymptomatic menopausal state: Secondary | ICD-10-CM

## 2022-07-12 DIAGNOSIS — M8580 Other specified disorders of bone density and structure, unspecified site: Secondary | ICD-10-CM

## 2022-07-12 NOTE — Telephone Encounter (Signed)
07/12/22 Next appt scheduled and confirmed with patient 

## 2022-07-13 ENCOUNTER — Encounter: Payer: Self-pay | Admitting: Hematology and Oncology

## 2022-08-15 DIAGNOSIS — C44519 Basal cell carcinoma of skin of other part of trunk: Secondary | ICD-10-CM | POA: Diagnosis not present

## 2022-08-17 ENCOUNTER — Encounter: Payer: Self-pay | Admitting: Hematology and Oncology

## 2022-08-29 DIAGNOSIS — C44712 Basal cell carcinoma of skin of right lower limb, including hip: Secondary | ICD-10-CM | POA: Diagnosis not present

## 2022-09-26 DIAGNOSIS — E559 Vitamin D deficiency, unspecified: Secondary | ICD-10-CM | POA: Diagnosis not present

## 2022-09-26 DIAGNOSIS — E782 Mixed hyperlipidemia: Secondary | ICD-10-CM | POA: Diagnosis not present

## 2022-09-26 DIAGNOSIS — I10 Essential (primary) hypertension: Secondary | ICD-10-CM | POA: Diagnosis not present

## 2022-09-26 DIAGNOSIS — Z6830 Body mass index (BMI) 30.0-30.9, adult: Secondary | ICD-10-CM | POA: Diagnosis not present

## 2022-11-28 DIAGNOSIS — C44712 Basal cell carcinoma of skin of right lower limb, including hip: Secondary | ICD-10-CM | POA: Diagnosis not present

## 2022-11-29 DIAGNOSIS — Z9181 History of falling: Secondary | ICD-10-CM | POA: Diagnosis not present

## 2022-11-29 DIAGNOSIS — Z0000 Encounter for general adult medical examination without abnormal findings: Secondary | ICD-10-CM | POA: Diagnosis not present

## 2022-11-29 DIAGNOSIS — Z1331 Encounter for screening for depression: Secondary | ICD-10-CM | POA: Diagnosis not present

## 2023-01-10 NOTE — Progress Notes (Signed)
[facility]  [address] 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  She was treated with lumpectomy and placed on adjuvant hormonal therapy with anastrozole 1 mg daily in December 2021. She remains without evidence of recurrence.  She knows to continue anastrozole daily.  She is scheduled for bilateral mammogram in September.  Osteopenia after menopause She remains on alendronate weekly, in addition to calcium/vitamin D daily.  She had slight worsening of the osteopenia in the hip in December.  We recommend to continue current treatment.  She will be due for bone density scan again in December 2025.    The patient understands the plans discussed today and is in agreement with them.  She knows to contact our office if she develops concerns prior to her next appointment.   I provided 20 minutes of face-to-face time during this encounter and > 50% was spent counseling as documented under my assessment and plan.    Ashley Perl, PA-C  [facility] [address] [place] [address] Dept: [phone] Dept Fax: [phone]   No orders of the defined types were placed in this encounter.     CHIEF COMPLAINT:  CC: Stage IA hormone receptor positive breast cancer  Current Treatment: Anastrozole 1 mg daily.  HISTORY OF PRESENT ILLNESS:  Ashley Larson is a 77 y.o. 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 This was found on routine screening mammogram in August 2021.  Diagnostic left mammogram  confirmed a lobulated hypoechoic mass in the left breast at 10 o'clock 2 cm from the nipple measuring 5.0 x 4.0 x 5.0 mm.  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  Estrogen receptor was positive at 100% and progesterone receptors were positive at 95%  HER2 was negative.  Ki67 was 10%.  She was treated with lumpectomy and left axillary sentinel lymph node biopsy in October.  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  All margins were clear of neoplasm. One lymph node was negative for malignancy.  Adjuvant radiation therapy was not recommended.  She was started on adjuvant hormonal therapy with anastrozole 1mg  daily in December 2021.  She has a history of osteoporosis of the spine, for which she has been on alendronate 70mg  weekly for several years.  Her last bone density scan was in 2017, so we scheduled her for a repeat. She has a history of melanoma of the left foot treated with surgical excision and skin grafting with sentinel lymph node biopsy several years ago in [place].  Apparently, the lymph node was negative.  She also has a history of basal cell carcinoma of the nose. She follows with her dermatologist Dr. Mayford Larson regularly. She had an allergic reaction to chlorhexidine with surgery.  Bone density scan in December 2021 revealed stable to improved osteopenia with a T-score of -2.3 in the spine and a T-score of -1.7 in the femur, for which she continues alendronate and calcium/vitamin-D. Bilateral diagnostic  mammogram in August 2022 and September 2023 did not reveal any evidence of disease. Bone density scan in December 2023 revealed stable to slightly worsened bone density with a T-score of -2.3 in the spine and a T-score of -1.9 in the femur. We recommended continuing alendronate and calcium/vitamin D. She has continued anastrozole daily without  difficulty.     Oncology History  Malignant neoplasm of upper-inner quadrant of left breast in female, estrogen receptor positive (HCC)  05/10/2020 Initial Diagnosis   Breast cancer, left (HCC)   05/17/2020 Cancer Staging   Staging form: Breast, AJCC 8th Edition - Clinical stage from 05/17/2020: Stage IA (cT1a, cN0(sn), cM0, G1, ER+, PR+, HER2-) - Signed by Ashley Beckwith, MD on 06/13/2020   Melanoma of skin (HCC)  10/12/2006 Initial Diagnosis   Melanoma of skin (HCC)       INTERVAL HISTORY:  Ashley Larson is here today for repeat clinical assessment.  She states she continues anastrozole daily without difficulty.  She is taking her alendronate weekly and continues daily calcium/vitamin D.  She denies any changes in her breasts.  She has fatigue relieved with rest.  She denies fevers or chills. She reports mild bilateral knee pain. Her appetite is good. Her weight has decreased 1 pounds over last 6 months .  She continues to follow with Dr. Lequita Larson and will see him again in September after her mammogram.  REVIEW OF SYSTEMS:  Review of Systems  Constitutional:  Positive for fatigue (Relieved with rest). Negative for appetite change, chills, fever and unexpected weight change.  HENT:   Negative for lump/mass, mouth sores and sore throat.   Respiratory:  Negative for cough and shortness of breath.   Cardiovascular:  Negative for chest pain and leg swelling.  Gastrointestinal:  Negative for abdominal pain, constipation, diarrhea, nausea and vomiting.  Endocrine: Negative for hot flashes.  Genitourinary:  Negative for difficulty urinating, dysuria, frequency and hematuria.   Musculoskeletal:  Positive for arthralgias (Mild bilateral knee pain). Negative for back pain and myalgias.  Skin:  Negative for rash.  Neurological:  Negative for dizziness and headaches.  Hematological:  Negative for adenopathy. Does not bruise/bleed easily.  Psychiatric/Behavioral:  Negative for depression and  sleep disturbance. The patient is not nervous/anxious.      VITALS:  Blood pressure 132/66, pulse 66, temperature 98.6 F (37 C), temperature source Oral, resp. rate 18, height 5' (1.524 m), weight 159 lb 4.8 oz (72.3 kg), last menstrual period 07/31/2000, SpO2 100 %.  Wt Readings from Last 3 Encounters:  01/11/23 159 lb 4.8 oz (72.3 kg)  07/12/22 160 lb 1.6 oz (72.6 kg)  01/10/22 164 lb 4.8 oz (74.5 kg)    Body mass index is 31.11 kg/m.  Performance status (ECOG): 1 - Symptomatic but completely ambulatory  PHYSICAL EXAM:  Physical Exam Vitals and nursing note reviewed.  Constitutional:      General: She is not in acute distress.    Appearance: Normal appearance.  HENT:     Head: Normocephalic and atraumatic.     Mouth/Throat:     Mouth: Mucous membranes are moist.     Pharynx: Oropharynx is clear. No oropharyngeal exudate or posterior oropharyngeal erythema.  Eyes:     General: No scleral icterus.    Extraocular Movements: Extraocular movements intact.     Conjunctiva/sclera: Conjunctivae normal.     Pupils: Pupils are equal, round, and reactive to light.  Cardiovascular:     Rate and Rhythm: Normal rate and regular rhythm.  Heart sounds: Normal heart sounds. No murmur heard.    No friction rub. No gallop.  Pulmonary:     Effort: Pulmonary effort is normal.     Breath sounds: Normal breath sounds. No wheezing, rhonchi or rales.  Chest:  Breasts:    Right: Normal. No inverted nipple, mass, nipple discharge, skin change or tenderness.     Left: Normal. No inverted nipple, mass, nipple discharge, skin change or tenderness.  Abdominal:     General: There is no distension.     Palpations: Abdomen is soft. There is no hepatomegaly, splenomegaly or mass.     Tenderness: There is no abdominal tenderness.  Musculoskeletal:        General: Normal range of motion.     Cervical back: Normal range of motion and neck supple. No tenderness.     Right lower leg: No edema.      Left lower leg: No edema.  Lymphadenopathy:     Cervical: No cervical adenopathy.     Upper Body:     Right upper body: No supraclavicular or axillary adenopathy.     Left upper body: No supraclavicular or axillary adenopathy.     Lower Body: No right inguinal adenopathy. No left inguinal adenopathy.  Skin:    General: Skin is warm and dry.     Coloration: Skin is not jaundiced.     Findings: No rash.  Neurological:     Mental Status: She is alert and oriented to person, place, and time.     Cranial Nerves: No cranial nerve deficit.  Psychiatric:        Mood and Affect: Mood normal.        Behavior: Behavior normal.        Thought Content: Thought content normal.     LABS:       No data to display             No data to display           No results found for: "CEA1", "CEA" / No results found for: "CEA1", "CEA" No results found for: "PSA1" No results found for: "ZOX096" No results found for: "CAN125"  No results found for: "TOTALPROTELP", "ALBUMINELP", "A1GS", "A2GS", "BETS", "BETA2SER", "GAMS", "MSPIKE", "SPEI" No results found for: "TIBC", "FERRITIN", "IRONPCTSAT" No results found for: "LDH"  STUDIES:  No results found.    HISTORY:   Past Medical History:  Diagnosis Date   Allergy    Arthritis    Breast cancer (HCC)    Hyperlipidemia    Hypertension    Melanoma of foot, left (HCC) 2008   treated with excision   Osteopenia    Skin cancer    History of basal cell carcinoma   Vitamin D deficiency     Past Surgical History:  Procedure Laterality Date   APPENDECTOMY     BASAL CELL CARCINOMA EXCISION  2014   SCALP   BREAST LUMPECTOMY WITH AXILLARY LYMPH NODE BIOPSY Left 05/17/2020   MELANOMA EXCISION WITH SENTINEL LYMPH NODE BIOPSY Left 2008   FOOT   TONSILLECTOMY  1954    Family History  Problem Relation Age of Onset   Prostate cancer Father        in his 10s   Pancreatic cancer Maternal Aunt        in her 47s    Social History:   reports that she quit smoking about 62 years ago. Her smoking use included cigarettes. She started smoking about 64 years  ago. She has a 1.00 pack-year smoking history. She has never used smokeless tobacco. She reports current alcohol use of about 1.0 standard drink of alcohol per week. She reports that she does not use drugs.The patient is alone today.  Allergies:  Allergies  Allergen Reactions   Wound Dressing Adhesive    Chlorhexidine Rash    Red raised rash with surrounding redness     Current Medications: Current Outpatient Medications  Medication Sig Dispense Refill   alendronate (FOSAMAX) 70 MG tablet Take 70 mg by mouth once a week.     anastrozole (ARIMIDEX) 1 MG tablet TAKE ONE TABLET BY MOUTH EVERY DAY 90 tablet 3   ezetimibe (ZETIA) 10 MG tablet Take 10 mg by mouth daily.     lisinopril-hydrochlorothiazide (ZESTORETIC) 20-12.5 MG tablet Take 1 tablet by mouth daily.     metoprolol succinate (TOPROL-XL) 25 MG 24 hr tablet Take 25 mg by mouth daily.     simvastatin (ZOCOR) 40 MG tablet Take 40 mg by mouth daily.     No current facility-administered medications for this visit.

## 2023-01-11 ENCOUNTER — Encounter: Payer: Self-pay | Admitting: Hematology and Oncology

## 2023-01-11 ENCOUNTER — Inpatient Hospital Stay: Payer: Medicare Other | Attending: Hematology and Oncology | Admitting: Hematology and Oncology

## 2023-01-11 VITALS — BP 132/66 | HR 66 | Temp 98.6°F | Resp 18 | Ht 60.0 in | Wt 159.3 lb

## 2023-01-11 DIAGNOSIS — Z780 Asymptomatic menopausal state: Secondary | ICD-10-CM | POA: Diagnosis not present

## 2023-01-11 DIAGNOSIS — M8580 Other specified disorders of bone density and structure, unspecified site: Secondary | ICD-10-CM

## 2023-01-11 DIAGNOSIS — Z170 Estrogen receptor positive status [ER+]: Secondary | ICD-10-CM | POA: Diagnosis not present

## 2023-01-11 DIAGNOSIS — C50212 Malignant neoplasm of upper-inner quadrant of left female breast: Secondary | ICD-10-CM

## 2023-01-11 NOTE — Assessment & Plan Note (Signed)
She remains on alendronate weekly, in addition to calcium/vitamin D daily.  She had slight worsening of the osteopenia in the hip in December.  We recommend to continue current treatment.  She will be due for bone density scan again in December 2025.

## 2023-01-11 NOTE — Assessment & Plan Note (Signed)
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  She was treated with lumpectomy and placed on adjuvant hormonal therapy with anastrozole 1 mg daily in December 2021. She remains without evidence of recurrence.  She knows to continue anastrozole daily.  She is scheduled for bilateral mammogram in September.

## 2023-01-25 DIAGNOSIS — I10 Essential (primary) hypertension: Secondary | ICD-10-CM | POA: Diagnosis not present

## 2023-01-25 DIAGNOSIS — Z6829 Body mass index (BMI) 29.0-29.9, adult: Secondary | ICD-10-CM | POA: Diagnosis not present

## 2023-01-25 DIAGNOSIS — E559 Vitamin D deficiency, unspecified: Secondary | ICD-10-CM | POA: Diagnosis not present

## 2023-01-25 DIAGNOSIS — L259 Unspecified contact dermatitis, unspecified cause: Secondary | ICD-10-CM | POA: Diagnosis not present

## 2023-01-25 DIAGNOSIS — Z853 Personal history of malignant neoplasm of breast: Secondary | ICD-10-CM | POA: Diagnosis not present

## 2023-01-25 DIAGNOSIS — E782 Mixed hyperlipidemia: Secondary | ICD-10-CM | POA: Diagnosis not present

## 2023-04-09 DIAGNOSIS — Z1231 Encounter for screening mammogram for malignant neoplasm of breast: Secondary | ICD-10-CM | POA: Diagnosis not present

## 2023-04-09 LAB — HM MAMMOGRAPHY

## 2023-04-20 DIAGNOSIS — C50312 Malignant neoplasm of lower-inner quadrant of left female breast: Secondary | ICD-10-CM | POA: Diagnosis not present

## 2023-05-30 DIAGNOSIS — Z853 Personal history of malignant neoplasm of breast: Secondary | ICD-10-CM | POA: Diagnosis not present

## 2023-05-30 DIAGNOSIS — M8580 Other specified disorders of bone density and structure, unspecified site: Secondary | ICD-10-CM | POA: Diagnosis not present

## 2023-05-30 DIAGNOSIS — Z139 Encounter for screening, unspecified: Secondary | ICD-10-CM | POA: Diagnosis not present

## 2023-05-30 DIAGNOSIS — Z23 Encounter for immunization: Secondary | ICD-10-CM | POA: Diagnosis not present

## 2023-05-30 DIAGNOSIS — Z6829 Body mass index (BMI) 29.0-29.9, adult: Secondary | ICD-10-CM | POA: Diagnosis not present

## 2023-05-30 DIAGNOSIS — I10 Essential (primary) hypertension: Secondary | ICD-10-CM | POA: Diagnosis not present

## 2023-05-30 DIAGNOSIS — E782 Mixed hyperlipidemia: Secondary | ICD-10-CM | POA: Diagnosis not present

## 2023-07-10 DIAGNOSIS — L578 Other skin changes due to chronic exposure to nonionizing radiation: Secondary | ICD-10-CM | POA: Diagnosis not present

## 2023-07-10 DIAGNOSIS — L814 Other melanin hyperpigmentation: Secondary | ICD-10-CM | POA: Diagnosis not present

## 2023-07-10 DIAGNOSIS — L821 Other seborrheic keratosis: Secondary | ICD-10-CM | POA: Diagnosis not present

## 2023-07-10 DIAGNOSIS — D225 Melanocytic nevi of trunk: Secondary | ICD-10-CM | POA: Diagnosis not present

## 2023-07-11 ENCOUNTER — Telehealth: Payer: Self-pay | Admitting: Hematology and Oncology

## 2023-07-11 ENCOUNTER — Inpatient Hospital Stay: Payer: Medicare Other | Admitting: Oncology

## 2023-07-11 ENCOUNTER — Encounter: Payer: Self-pay | Admitting: Hematology and Oncology

## 2023-07-11 ENCOUNTER — Inpatient Hospital Stay: Payer: Medicare Other | Attending: Hematology and Oncology | Admitting: Hematology and Oncology

## 2023-07-11 VITALS — BP 142/68 | HR 72 | Temp 97.7°F | Resp 18 | Ht 60.0 in | Wt 156.5 lb

## 2023-07-11 DIAGNOSIS — Z780 Asymptomatic menopausal state: Secondary | ICD-10-CM

## 2023-07-11 DIAGNOSIS — Z170 Estrogen receptor positive status [ER+]: Secondary | ICD-10-CM | POA: Diagnosis not present

## 2023-07-11 DIAGNOSIS — C50212 Malignant neoplasm of upper-inner quadrant of left female breast: Secondary | ICD-10-CM | POA: Diagnosis not present

## 2023-07-11 DIAGNOSIS — M8589 Other specified disorders of bone density and structure, multiple sites: Secondary | ICD-10-CM | POA: Diagnosis not present

## 2023-07-11 DIAGNOSIS — M8580 Other specified disorders of bone density and structure, unspecified site: Secondary | ICD-10-CM | POA: Diagnosis not present

## 2023-07-11 DIAGNOSIS — Z79811 Long term (current) use of aromatase inhibitors: Secondary | ICD-10-CM | POA: Insufficient documentation

## 2023-07-11 NOTE — Assessment & Plan Note (Signed)
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  She was treated with lumpectomy and placed on adjuvant hormonal therapy with anastrozole 1 mg daily in December 2021. She remains without evidence of recurrence.  She knows to continue anastrozole daily.  She is scheduled for bilateral mammogram in September.

## 2023-07-11 NOTE — Progress Notes (Cosign Needed Addendum)
[facility]  [address] 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  She was treated with lumpectomy and placed on adjuvant hormonal therapy with anastrozole 1 mg daily in December 2021, which she will continue for at least 5 years. Bilateral screening mammogram in September 2024 did not reveal any evidence of malignancy.. She remains without evidence of recurrence.  She knows to continue anastrozole daily.   Osteopenia after menopause She remains on alendronate weekly, in addition to calcium/vitamin D daily.  She had slight worsening of the osteopenia in the hip in December 2023.  We recommended continuing the current treatment.  She will be due for bone density scan again in December 2025.    The patient understands the plans discussed today and is in agreement with them.  She knows to contact our office if she develops concerns prior to her next appointment.   I provided 10 minutes of face-to-face time during this encounter and > 50% was spent counseling as documented under my assessment and plan.   Ashley Perl, PA-C  [facility] [place] [facility] [address] [place] [address] Dept: [phone] Dept Fax: [phone]   No orders of the defined types were placed in this encounter.   CHIEF COMPLAINT:  CC:  Stage IA hormone receptor positive breast cancer   Current Treatment:  Anastrozole 1 mg daily.   HISTORY OF PRESENT ILLNESS:  Ashley Larson is a 77 y.o. 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 This  was found on routine screening mammogram in August 2021.  Diagnostic left mammogram confirmed a lobulated hypoechoic mass in the left breast at 10 o'clock 2 cm from the nipple measuring 5.0 x 4.0 x 5.0 mm.  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  Estrogen receptor was positive at 100% and progesterone receptors were positive at 95%  HER2 was negative.  Ki67 was 10%.  She was treated with lumpectomy and left axillary sentinel lymph node biopsy in October.  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  All margins were clear of neoplasm. One lymph node was negative for malignancy.  Adjuvant radiation therapy was not recommended.  She was started on adjuvant hormonal therapy with anastrozole 1mg  daily in December 2021.    She had a history of osteoporosis of the spine, for which she was on alendronate 70mg  weekly for several years.   Bone density scan in 2017 revealed T-score of -1.8 in the spine, previously -2.4, a T-score of -1.7 in the left femur neck, previously -1.8.  Bone density scan in December 2021 revealed a T-score of -2.3 in the spine and a T-score of -1.7 in the left femur neck. We recommended continuing alendronate weekly, in addition to calcium and vitamin D. Bilateral diagnostic mammogram in August 2022 did not reveal any evidence of malignancy.  Bilateral diagnostic mammogram in September 2023 did not reveal any evidence of malignancy. Bone density scan in December  2023 revealed stable to slightly worsened bone density with a T-score of -2.3 in the spine and a T-score of -1.9 in the femur.   She also has a history of melanoma of the left foot treated with surgical excision and skin grafting with sentinel lymph node biopsy several years ago in [place].  Apparently, the lymph node was negative.  She also has a history of basal cell  carcinoma of the nose. She follows with her dermatologist Dr. Mayford Knife regularly.    Oncology History  Malignant neoplasm of upper-inner quadrant of left breast in female, estrogen receptor positive (HCC)  05/10/2020 Initial Diagnosis   Breast cancer, left (HCC)   05/17/2020 Cancer Staging   Staging form: Breast, AJCC 8th Edition - Clinical stage from 05/17/2020: Stage IA (cT1a, cN0(sn), cM0, G1, ER+, PR+, HER2-) - Signed by Dellia Beckwith, MD on 06/13/2020   Melanoma of skin (HCC)  10/12/2006 Initial Diagnosis   Melanoma of skin (HCC)       INTERVAL HISTORY:  Ashley Larson is here today for repeat clinical assessment. She continues anastrozole daily without significant difficulty. She denies any changes in her breasts.  She denies fevers or chills. She denies pain. Her appetite is good. Her weight has decreased 3 pounds over last 6 months .She continues to follow up with Dr. Lequita Halt who orders her mammograms. Bilateral screening mammogram in September was negative. She has routine labs drawn at Dr. Hoy Finlay office.  REVIEW OF SYSTEMS:  Review of Systems  Constitutional: Negative.  Negative for appetite change, chills, fatigue, fever and unexpected weight change.  HENT:  Negative.  Negative for lump/mass, mouth sores and sore throat.   Eyes: Negative.   Respiratory: Negative.  Negative for cough and shortness of breath.   Cardiovascular: Negative.  Negative for chest pain and leg swelling.  Gastrointestinal: Negative.  Negative for abdominal pain, constipation, diarrhea, nausea and vomiting.  Endocrine: Negative.  Negative for hot flashes.  Genitourinary: Negative.  Negative for difficulty urinating, dysuria, frequency, hematuria, vaginal bleeding and vaginal discharge.   Musculoskeletal: Negative.  Negative for arthralgias, back pain, gait problem and myalgias.  Skin:  Negative for rash.  Neurological: Negative.  Negative for dizziness, extremity weakness, gait problem, headaches  and light-headedness.  Hematological: Negative.  Negative for adenopathy. Does not bruise/bleed easily.  Psychiatric/Behavioral: Negative.  Negative for depression and sleep disturbance. The patient is not nervous/anxious.   All other systems reviewed and are negative.    VITALS:  Blood pressure (!) 142/68, pulse 72, temperature 97.7 F (36.5 C), temperature source Oral, resp. rate 18, height 5' (1.524 m), weight 156 lb 8 oz (71 kg), last menstrual period 07/31/2000, SpO2 93%.  Wt Readings from Last 3 Encounters:  07/11/23 156 lb 8 oz (71 kg)  01/11/23 159 lb 4.8 oz (72.3 kg)  07/12/22 160 lb 1.6 oz (72.6 kg)    Body mass index is 30.56 kg/m.  Performance status (ECOG): 0 - Asymptomatic  PHYSICAL EXAM:  Physical Exam Vitals and nursing note reviewed.  Constitutional:      General: She is not in acute distress.    Appearance: Normal appearance. She is normal weight.  HENT:     Head: Normocephalic and atraumatic.     Mouth/Throat:     Mouth: Mucous membranes are moist.     Pharynx: Oropharynx is clear. No oropharyngeal exudate or posterior oropharyngeal erythema.  Eyes:     General: No scleral icterus.    Extraocular Movements: Extraocular movements intact.  Conjunctiva/sclera: Conjunctivae normal.     Pupils: Pupils are equal, round, and reactive to light.  Cardiovascular:     Rate and Rhythm: Normal rate and regular rhythm.     Heart sounds: Normal heart sounds. No murmur heard.    No friction rub. No gallop.  Pulmonary:     Effort: Pulmonary effort is normal.     Breath sounds: Normal breath sounds. No wheezing, rhonchi or rales.  Chest:  Breasts:    Right: Normal. No swelling, bleeding, inverted nipple, mass, nipple discharge, skin change or tenderness.     Left: Normal. No swelling, bleeding, inverted nipple, mass, nipple discharge, skin change or tenderness.  Abdominal:     General: There is no distension.     Palpations: Abdomen is soft. There is no  hepatomegaly, splenomegaly or mass.     Tenderness: There is no abdominal tenderness.  Musculoskeletal:        General: Normal range of motion.     Cervical back: Normal range of motion and neck supple. No tenderness.     Right lower leg: No edema.     Left lower leg: No edema.  Lymphadenopathy:     Cervical: No cervical adenopathy.     Upper Body:     Right upper body: No supraclavicular or axillary adenopathy.     Left upper body: No supraclavicular or axillary adenopathy.     Lower Body: No right inguinal adenopathy. No left inguinal adenopathy.  Skin:    General: Skin is warm and dry.     Coloration: Skin is not jaundiced.     Findings: No rash.  Neurological:     General: No focal deficit present.     Mental Status: She is alert and oriented to person, place, and time. Mental status is at baseline.     Cranial Nerves: No cranial nerve deficit.  Psychiatric:        Mood and Affect: Mood normal.        Behavior: Behavior normal.        Thought Content: Thought content normal.        Judgment: Judgment normal.     LABS:       No data to display             No data to display          STUDIES:    Exam(s): [id number] MAM/MAM DIGITAL TOMO SCREENING B  CLINICAL DATA: Screening.  EXAM:  DIGITAL SCREENING BILATERAL MAMMOGRAM WITH TOMOSYNTHESIS AND CAD  TECHNIQUE:  Bilateral screening digital craniocaudal and mediolateral oblique  mammograms were obtained. Bilateral screening digital breast  tomosynthesis was performed. The images were evaluated with  computer-aided detection.  COMPARISON: Previous exam(s).  ACR Breast Density Category b: There are scattered areas of  fibroglandular density.  FINDINGS:  There are no findings suspicious for malignancy.  IMPRESSION:  No mammographic evidence of malignancy. A result letter of this  screening mammogram will be mailed directly to the patient.  RECOMMENDATION:  Screening mammogram in one year. (Code:SM-B-01Y)   BI-RADS CATEGORY 1: Negative.    HISTORY:   Past Medical History:  Diagnosis Date   Allergy    Arthritis    Breast cancer (HCC)    Hyperlipidemia    Hypertension    Melanoma of foot, left (HCC) 2008   treated with excision   Osteopenia    Skin cancer    History of basal cell carcinoma   Vitamin D deficiency  Past Surgical History:  Procedure Laterality Date   APPENDECTOMY     BASAL CELL CARCINOMA EXCISION  2014   SCALP   BREAST LUMPECTOMY WITH AXILLARY LYMPH NODE BIOPSY Left 05/17/2020   MELANOMA EXCISION WITH SENTINEL LYMPH NODE BIOPSY Left 2008   FOOT   TONSILLECTOMY  1954    Family History  Problem Relation Age of Onset   Prostate cancer Father        in his 60s   Pancreatic cancer Maternal Aunt        in her 30s    Social History:  reports that she quit smoking about 62 years ago. Her smoking use included cigarettes. She started smoking about 64 years ago. She has a 1 pack-year smoking history. She has never used smokeless tobacco. She reports current alcohol use of about 1.0 standard drink of alcohol per week. She reports that she does not use drugs.The patient is alone today.  Allergies:  Allergies  Allergen Reactions   Wound Dressing Adhesive    Chlorhexidine Rash    Red raised rash with surrounding redness     Current Medications: Current Outpatient Medications  Medication Sig Dispense Refill   aspirin EC 81 MG tablet Take 81 mg by mouth daily. Swallow whole.     betamethasone valerate lotion (VALISONE) 0.1 % Apply topically.     cholecalciferol (VITAMIN D3) 25 MCG (1000 UNIT) tablet Take 1,000 Units by mouth daily.     alendronate (FOSAMAX) 70 MG tablet Take 70 mg by mouth once a week.     anastrozole (ARIMIDEX) 1 MG tablet TAKE ONE TABLET BY MOUTH EVERY DAY 90 tablet 3   ezetimibe (ZETIA) 10 MG tablet Take 10 mg by mouth daily.     lisinopril-hydrochlorothiazide (ZESTORETIC) 20-12.5 MG tablet Take 1 tablet by mouth daily.     metoprolol  succinate (TOPROL-XL) 25 MG 24 hr tablet Take 25 mg by mouth daily.     simvastatin (ZOCOR) 40 MG tablet Take 40 mg by mouth daily.     No current facility-administered medications for this visit.    I,Jasmine M Lassiter,acting as a Neurosurgeon for [employer], PA-C.,have documented all relevant documentation on the behalf of Ashley Perl, PA-C,as directed by  Ashley Perl, PA-C while in the presence of [employer], PA-C.

## 2023-07-11 NOTE — Telephone Encounter (Signed)
Patient has been scheduled for follow-up visit per 07/11/23 LOS.  Pt given an appt calendar with date and time.

## 2023-07-11 NOTE — Assessment & Plan Note (Signed)
She remains on alendronate weekly, in addition to calcium/vitamin D daily.  She had slight worsening of the osteopenia in the hip in December.  We recommend to continue current treatment.  She will be due for bone density scan again in December 2025.

## 2023-08-29 DIAGNOSIS — Z6829 Body mass index (BMI) 29.0-29.9, adult: Secondary | ICD-10-CM | POA: Diagnosis not present

## 2023-08-29 DIAGNOSIS — E782 Mixed hyperlipidemia: Secondary | ICD-10-CM | POA: Diagnosis not present

## 2023-08-29 DIAGNOSIS — Z853 Personal history of malignant neoplasm of breast: Secondary | ICD-10-CM | POA: Diagnosis not present

## 2023-08-29 DIAGNOSIS — I10 Essential (primary) hypertension: Secondary | ICD-10-CM | POA: Diagnosis not present

## 2023-08-29 DIAGNOSIS — M778 Other enthesopathies, not elsewhere classified: Secondary | ICD-10-CM | POA: Diagnosis not present

## 2023-10-18 DIAGNOSIS — M25532 Pain in left wrist: Secondary | ICD-10-CM | POA: Diagnosis not present

## 2023-10-30 DIAGNOSIS — M654 Radial styloid tenosynovitis [de Quervain]: Secondary | ICD-10-CM | POA: Diagnosis not present

## 2023-11-26 ENCOUNTER — Other Ambulatory Visit: Payer: Self-pay | Admitting: Oncology

## 2023-11-26 DIAGNOSIS — C50212 Malignant neoplasm of upper-inner quadrant of left female breast: Secondary | ICD-10-CM

## 2023-11-30 DIAGNOSIS — Z9181 History of falling: Secondary | ICD-10-CM | POA: Diagnosis not present

## 2023-11-30 DIAGNOSIS — Z0000 Encounter for general adult medical examination without abnormal findings: Secondary | ICD-10-CM | POA: Diagnosis not present

## 2023-12-11 DIAGNOSIS — M654 Radial styloid tenosynovitis [de Quervain]: Secondary | ICD-10-CM | POA: Diagnosis not present

## 2024-01-01 NOTE — Progress Notes (Incomplete)
 [facility]  [address] 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  She was treated with lumpectomy and placed on adjuvant hormonal therapy with anastrozole  1 mg daily in December 2021, which she will continue for at least 5 years. Bilateral screening mammogram in September 2024 did not reveal any evidence of malignancy.. She remains without evidence of recurrence.  She knows to continue anastrozole  daily.   Osteopenia after menopause She remains on alendronate weekly, in addition to calcium/vitamin D daily.  She had slight worsening of the osteopenia in the hip in December 2023.  We recommended continuing the current treatment.  She will be due for bone density scan again in December 2025.   Plan:   The patient understands the plans discussed today and is in agreement with them.  She knows to contact our office if she develops concerns prior to her next appointment.   I provided 10 minutes of face-to-face time during this encounter and > 50% was spent counseling as documented under my assessment and plan.   Nolia Baumgartner, MD  [facility] - [facility] OF [facility] [address] [place] [address] Dept: [phone] Dept Fax: [phone]   No orders of the defined types were placed in this encounter.   CHIEF COMPLAINT:  CC:  Stage IA hormone receptor positive breast cancer   Current Treatment:  Anastrozole  1 mg daily.   HISTORY OF PRESENT ILLNESS:  Ashley Larson is a 78 y.o. 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 This was  found on routine screening mammogram in August 2021.  Diagnostic left mammogram confirmed a lobulated hypoechoic mass in the left breast at 10 o'clock 2 cm from the nipple measuring 5.0 x 4.0 x 5.0 mm.  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  Estrogen receptor was positive at 100% and progesterone receptors were positive at 95%  HER2 was negative.  Ki67 was 10%.  She was treated with lumpectomy and left axillary sentinel lymph node biopsy in October.  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  All margins were clear of neoplasm. One lymph node was negative for malignancy.  Adjuvant radiation therapy was not recommended.  She was started on adjuvant hormonal therapy with anastrozole  1mg  daily in December 2021.    She had a history of osteoporosis of the spine, for which she was on alendronate 70mg  weekly for several years.   Bone density scan in 2017 revealed T-score of -1.8 in the spine, previously -2.4, a T-score of -1.7 in the left femur neck, previously -1.8.  Bone density scan in December 2021 revealed a T-score of -2.3 in the spine and a T-score of -1.7 in the left femur neck. We recommended continuing alendronate weekly, in addition to calcium and vitamin D. Bilateral diagnostic mammogram in August 2022 did not reveal any evidence of malignancy.  Bilateral diagnostic mammogram in September 2023 did not reveal any evidence of malignancy. Bone density scan in December 2023 revealed  stable to slightly worsened bone density with a T-score of -2.3 in the spine and a T-score of -1.9 in the femur.   She also has a history of melanoma of the left foot treated with surgical excision and skin grafting with sentinel lymph node biopsy several years ago in [place] .  Apparently, the lymph node was negative.  She also has a history of basal cell carcinoma of  the nose. She follows with her dermatologist Dr. Broadus Canes regularly.    Oncology History  Malignant neoplasm of upper-inner quadrant of left breast in female, estrogen receptor positive (HCC)  05/10/2020 Initial Diagnosis   Breast cancer, left (HCC)   05/17/2020 Cancer Staging   Staging form: Breast, AJCC 8th Edition - Clinical stage from 05/17/2020: Stage IA (cT1a, cN0(sn), cM0, G1, ER+, PR+, HER2-) - Signed by Nolia Baumgartner, MD on 06/13/2020   Melanoma of skin (HCC)  10/12/2006 Initial Diagnosis   Melanoma of skin (HCC)       INTERVAL HISTORY:  Ashley Larson is here today for repeat clinical assessment for her stage IA hormone receptor positive breast cancer. Patient states that she feels *** and ***.        She denies fever, chills, night sweats, or other signs of infection. She denies cardiorespiratory and gastrointestinal issues. She  denies pain. Her appetite is *** and Her weight {Weight change:10426}.    She continues anastrozole  daily without significant difficulty. She denies any changes in her breasts.  She denies fevers or chills. She denies pain. Her appetite is good. Her weight has decreased 3 pounds over last 6 months.She continues to follow up with Dr. Britta Candy who orders her mammograms. Bilateral screening mammogram in September was negative. She has routine labs drawn at Dr. Vianne Grad office.  REVIEW OF SYSTEMS:  Review of Systems  Constitutional: Negative.  Negative for appetite change, chills, fatigue, fever and unexpected weight change.  HENT:  Negative.  Negative for lump/mass, mouth sores and sore throat.   Eyes: Negative.   Respiratory: Negative.  Negative for chest tightness, cough, hemoptysis, shortness of breath and wheezing.   Cardiovascular: Negative.  Negative for chest pain, leg swelling and palpitations.  Gastrointestinal: Negative.  Negative for abdominal distention, abdominal pain, blood in stool, constipation, diarrhea, nausea and vomiting.   Endocrine: Negative.  Negative for hot flashes.  Genitourinary: Negative.  Negative for difficulty urinating, dysuria, frequency, hematuria, vaginal bleeding and vaginal discharge.   Musculoskeletal: Negative.  Negative for arthralgias, back pain, flank pain, gait problem and myalgias.  Skin: Negative.  Negative for rash.  Neurological: Negative.  Negative for dizziness, extremity weakness, gait problem, headaches, light-headedness, numbness, seizures and speech difficulty.  Hematological: Negative.  Negative for adenopathy. Does not bruise/bleed easily.  Psychiatric/Behavioral: Negative.  Negative for depression and sleep disturbance. The patient is not nervous/anxious.   All other systems reviewed and are negative.    VITALS:  Last menstrual period 07/31/2000.  Wt Readings from Last 3 Encounters:  07/11/23 156 lb 8 oz (71 kg)  01/11/23 159 lb 4.8 oz (72.3 kg)  07/12/22 160 lb 1.6 oz (72.6 kg)    There is no height or weight on file to calculate BMI.  Performance status (ECOG): 0 - Asymptomatic  PHYSICAL EXAM:  Physical Exam Vitals and nursing note reviewed.  Constitutional:      General: She is not in acute distress.    Appearance: Normal appearance. She is normal weight.  HENT:     Head: Normocephalic and atraumatic.  Mouth/Throat:     Mouth: Mucous membranes are moist.     Pharynx: Oropharynx is clear. No oropharyngeal exudate or posterior oropharyngeal erythema.  Eyes:     General: No scleral icterus.    Extraocular Movements: Extraocular movements intact.     Conjunctiva/sclera: Conjunctivae normal.     Pupils: Pupils are equal, round, and reactive to light.  Cardiovascular:     Rate and Rhythm: Normal rate and regular rhythm.     Heart sounds: Normal heart sounds. No murmur heard.    No friction rub. No gallop.  Pulmonary:     Effort: Pulmonary effort is normal.     Breath sounds: Normal breath sounds. No wheezing, rhonchi or rales.  Chest:  Breasts:     Right: Normal. No swelling, bleeding, inverted nipple, mass, nipple discharge, skin change or tenderness.     Left: Normal. No swelling, bleeding, inverted nipple, mass, nipple discharge, skin change or tenderness.  Abdominal:     General: There is no distension.     Palpations: Abdomen is soft. There is no hepatomegaly, splenomegaly or mass.     Tenderness: There is no abdominal tenderness.  Musculoskeletal:        General: Normal range of motion.     Cervical back: Normal range of motion and neck supple. No tenderness.     Right lower leg: No edema.     Left lower leg: No edema.  Lymphadenopathy:     Cervical: No cervical adenopathy.     Upper Body:     Right upper body: No supraclavicular or axillary adenopathy.     Left upper body: No supraclavicular or axillary adenopathy.     Lower Body: No right inguinal adenopathy. No left inguinal adenopathy.  Skin:    General: Skin is warm and dry.     Coloration: Skin is not jaundiced.     Findings: No rash.  Neurological:     General: No focal deficit present.     Mental Status: She is alert and oriented to person, place, and time. Mental status is at baseline.     Cranial Nerves: No cranial nerve deficit.  Psychiatric:        Mood and Affect: Mood normal.        Behavior: Behavior normal.        Thought Content: Thought content normal.        Judgment: Judgment normal.     LABS:  No results found for: "WBC", "HGB", "HCT", "MCV", "PLT" No results found for: "CREATININE", "BUN", "NA", "K", "CL", "CO2" No results found for: "PROT", "ALBUMIN", "AST", "ALT", "ALKPHOS", "BILITOT", "BILIDIR", "IBILI"  No results found for: "IRON", "TIBC", "FERRITIN" No results found for: "TSH", "T3TOTAL", "T4TOTAL", "THYROIDAB"  STUDIES:      HISTORY:   Past Medical History:  Diagnosis Date   Allergy    Arthritis    Breast cancer (HCC)    Hyperlipidemia    Hypertension    Melanoma of foot, left (HCC) 2008   treated with excision    Osteopenia    Skin cancer    History of basal cell carcinoma   Vitamin D deficiency     Past Surgical History:  Procedure Laterality Date   APPENDECTOMY     BASAL CELL CARCINOMA EXCISION  2014   SCALP   BREAST LUMPECTOMY WITH AXILLARY LYMPH NODE BIOPSY Left 05/17/2020   MELANOMA EXCISION WITH SENTINEL LYMPH NODE BIOPSY Left 2008   FOOT   TONSILLECTOMY  1954  Family History  Problem Relation Age of Onset   Prostate cancer Father        in his 80s   Pancreatic cancer Maternal Aunt        in her 21s    Social History:  reports that she quit smoking about 63 years ago. Her smoking use included cigarettes. She started smoking about 65 years ago. She has a 1 pack-year smoking history. She has never used smokeless tobacco. She reports current alcohol use of about 1.0 standard drink of alcohol per week. She reports that she does not use drugs.The patient is alone today.  Allergies:  Allergies  Allergen Reactions   Wound Dressing Adhesive    Chlorhexidine Rash    Red raised rash with surrounding redness     Current Medications: Current Outpatient Medications  Medication Sig Dispense Refill   alendronate (FOSAMAX) 70 MG tablet Take 70 mg by mouth once a week.     anastrozole  (ARIMIDEX ) 1 MG tablet TAKE ONE TABLET BY MOUTH EVERY DAY 90 tablet 3   aspirin EC 81 MG tablet Take 81 mg by mouth daily. Swallow whole.     betamethasone valerate lotion (VALISONE) 0.1 % Apply topically.     cholecalciferol (VITAMIN D3) 25 MCG (1000 UNIT) tablet Take 1,000 Units by mouth daily.     ezetimibe (ZETIA) 10 MG tablet Take 10 mg by mouth daily.     lisinopril-hydrochlorothiazide (ZESTORETIC) 20-12.5 MG tablet Take 1 tablet by mouth daily.     metoprolol succinate (TOPROL-XL) 25 MG 24 hr tablet Take 25 mg by mouth daily.     simvastatin (ZOCOR) 40 MG tablet Take 40 mg by mouth daily.     No current facility-administered medications for this visit.    I,Jasmine M Lassiter,acting as a scribe  for Nolia Baumgartner, MD.,have documented all relevant documentation on the behalf of Nolia Baumgartner, MD,as directed by  Nolia Baumgartner, MD while in the presence of Nolia Baumgartner, MD.

## 2024-01-02 DIAGNOSIS — Z6829 Body mass index (BMI) 29.0-29.9, adult: Secondary | ICD-10-CM | POA: Diagnosis not present

## 2024-01-02 DIAGNOSIS — M8580 Other specified disorders of bone density and structure, unspecified site: Secondary | ICD-10-CM | POA: Diagnosis not present

## 2024-01-02 DIAGNOSIS — Z853 Personal history of malignant neoplasm of breast: Secondary | ICD-10-CM | POA: Diagnosis not present

## 2024-01-02 DIAGNOSIS — I10 Essential (primary) hypertension: Secondary | ICD-10-CM | POA: Diagnosis not present

## 2024-01-02 DIAGNOSIS — E782 Mixed hyperlipidemia: Secondary | ICD-10-CM | POA: Diagnosis not present

## 2024-01-08 ENCOUNTER — Inpatient Hospital Stay: Payer: Medicare Other | Admitting: Oncology

## 2024-01-10 DIAGNOSIS — D2239 Melanocytic nevi of other parts of face: Secondary | ICD-10-CM | POA: Diagnosis not present

## 2024-01-10 DIAGNOSIS — L821 Other seborrheic keratosis: Secondary | ICD-10-CM | POA: Diagnosis not present

## 2024-01-10 DIAGNOSIS — L578 Other skin changes due to chronic exposure to nonionizing radiation: Secondary | ICD-10-CM | POA: Diagnosis not present

## 2024-01-10 DIAGNOSIS — L814 Other melanin hyperpigmentation: Secondary | ICD-10-CM | POA: Diagnosis not present

## 2024-01-17 ENCOUNTER — Other Ambulatory Visit: Payer: Self-pay | Admitting: Oncology

## 2024-01-17 ENCOUNTER — Encounter: Payer: Self-pay | Admitting: Oncology

## 2024-01-17 ENCOUNTER — Inpatient Hospital Stay: Attending: Oncology | Admitting: Oncology

## 2024-01-17 VITALS — BP 143/64 | HR 64 | Temp 97.8°F | Resp 16 | Ht 60.0 in | Wt 155.6 lb

## 2024-01-17 DIAGNOSIS — Z85820 Personal history of malignant melanoma of skin: Secondary | ICD-10-CM | POA: Diagnosis not present

## 2024-01-17 DIAGNOSIS — M8580 Other specified disorders of bone density and structure, unspecified site: Secondary | ICD-10-CM | POA: Diagnosis not present

## 2024-01-17 DIAGNOSIS — C50212 Malignant neoplasm of upper-inner quadrant of left female breast: Secondary | ICD-10-CM | POA: Insufficient documentation

## 2024-01-17 DIAGNOSIS — Z170 Estrogen receptor positive status [ER+]: Secondary | ICD-10-CM | POA: Diagnosis not present

## 2024-01-17 DIAGNOSIS — C439 Malignant melanoma of skin, unspecified: Secondary | ICD-10-CM

## 2024-01-17 DIAGNOSIS — Z780 Asymptomatic menopausal state: Secondary | ICD-10-CM

## 2024-01-18 ENCOUNTER — Telehealth: Payer: Self-pay | Admitting: Oncology

## 2024-01-18 NOTE — Telephone Encounter (Signed)
 Dexa has been scheduled for 07/09/24; Checking in @ 11 am at [facility].  Notified pt of date,time, instructions and location.

## 2024-01-22 DIAGNOSIS — R197 Diarrhea, unspecified: Secondary | ICD-10-CM | POA: Diagnosis not present

## 2024-01-22 DIAGNOSIS — A084 Viral intestinal infection, unspecified: Secondary | ICD-10-CM | POA: Diagnosis not present

## 2024-01-22 DIAGNOSIS — E663 Overweight: Secondary | ICD-10-CM | POA: Diagnosis not present

## 2024-01-22 DIAGNOSIS — Z6828 Body mass index (BMI) 28.0-28.9, adult: Secondary | ICD-10-CM | POA: Diagnosis not present

## 2024-01-22 DIAGNOSIS — R109 Unspecified abdominal pain: Secondary | ICD-10-CM | POA: Diagnosis not present

## 2024-01-28 DIAGNOSIS — R8290 Unspecified abnormal findings in urine: Secondary | ICD-10-CM | POA: Diagnosis not present

## 2024-01-28 DIAGNOSIS — Z6827 Body mass index (BMI) 27.0-27.9, adult: Secondary | ICD-10-CM | POA: Diagnosis not present

## 2024-01-28 DIAGNOSIS — R109 Unspecified abdominal pain: Secondary | ICD-10-CM | POA: Diagnosis not present

## 2024-01-28 DIAGNOSIS — K2970 Gastritis, unspecified, without bleeding: Secondary | ICD-10-CM | POA: Diagnosis not present

## 2024-01-28 NOTE — Progress Notes (Signed)
 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  She was treated with lumpectomy and placed on adjuvant hormonal therapy with anastrozole  1 mg daily in December 2021, which she will continue for at least 5 years. Bilateral screening mammogram in September 2024 did not reveal any evidence of malignancy.. She remains without evidence of recurrence.  She knows to continue anastrozole  daily.   Osteopenia after menopause She remains on alendronate weekly, in addition to calcium/vitamin D daily.  She had slight worsening of the osteopenia in the hip in December 2023.  We recommended continuing the current treatment.  She will be due for bone density scan again in December 2025.   Plan: She continues anastrozole  daily without significant difficulty. She denies any changes in her breasts.  She denies fevers or chills. She denies pain. Her appetite is good. Her weight has decreased 1 pounds over last 6 months.She continues to follow up with Dr. Joesph who orders her mammograms. Bilateral screening mammogram in September was negative. She has routine labs drawn at Dr. Margarete office. I will see her back in 6 months with bone density scan. If all is well, we can go to yearly visits, or see her annually in September if she wants me to take over ordering her mammograms. The patient understands the plans discussed today and is in agreement with them.  She knows to contact our office if she develops concerns prior to her next appointment.   I provided 20 minutes of face-to-face time during this encounter and > 50% was spent counseling as documented  under my assessment and plan.   Wanda Cornish MD [facility] - [facility] OF [facility] [address] [place] [address] Dept: [phone] Dept Fax: [phone]   No orders of the defined types were placed in this encounter.   CHIEF COMPLAINT:  CC:  Stage IA hormone receptor positive breast cancer   Current Treatment:  Anastrozole  1 mg daily.   HISTORY OF PRESENT ILLNESS:  Ashley Larson is a 78 y.o. 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 This was found on routine screening mammogram in August 2021.  Diagnostic left mammogram confirmed a lobulated hypoechoic mass in the left breast at 10 o'clock 2 cm from the nipple measuring 5.0 x 4.0 x 5.0 mm.  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  Estrogen receptor was positive at 100% and progesterone receptors were positive at 95%  HER2 was negative.  Ki67 was 10%.  She was treated with lumpectomy and left axillary sentinel lymph node biopsy in October.  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  All margins were clear of neoplasm. One lymph node was negative for malignancy.  Adjuvant radiation therapy was not recommended.  She was started on adjuvant hormonal therapy with anastrozole  1mg  daily in December 2021.    She had a history of osteoporosis of the spine, for which she was on alendronate 70mg   weekly for several years.   Bone density scan in 2017 revealed T-score of -1.8 in the spine, previously -2.4, a T-score of -1.7 in the left femur neck, previously -1.8.  Bone density scan in December 2021 revealed a T-score of -2.3 in the spine and a T-score of -1.7 in the left femur neck. We recommended continuing alendronate weekly, in addition to calcium and vitamin D.  Bilateral diagnostic mammogram in August 2022 did not reveal any evidence of malignancy.  Bilateral diagnostic mammogram in September 2023 did not reveal any evidence of malignancy. Bone density scan in December 2023 revealed stable to slightly worsened bone density with a T-score of -2.3 in the spine and a T-score of -1.9 in the femur.   She also has a history of melanoma of the left foot treated with surgical excision and skin grafting with sentinel lymph node biopsy several years ago in [place] .  Apparently, the lymph node was negative.  She also has a history of basal cell carcinoma of the nose. She follows with her dermatologist Dr. Trudy regularly.    Oncology History  Malignant neoplasm of upper-inner quadrant of left breast in female, estrogen receptor positive (HCC)  05/10/2020 Initial Diagnosis   Breast cancer, left (HCC)   05/17/2020 Cancer Staging   Staging form: Breast, AJCC 8th Edition - Clinical stage from 05/17/2020: Stage IA (cT1a, cN0(sn), cM0, G1, ER+, PR+, HER2-) - Signed by Cornelius Wanda DEL, MD on 06/13/2020   Melanoma of skin (HCC)  10/12/2006 Initial Diagnosis   Melanoma of skin (HCC)       INTERVAL HISTORY:  Ashley Larson is here today for repeat clinical assessment for her stage IA left breast cancer diagnosed in October of 2021. She continues anastrozole  daily without significant difficulty. She denies any changes in her breasts.  She denies fevers or chills. She denies pain. Her appetite is good. Her weight has decreased 1 pounds over last 6 months.She continues to follow up with Dr. Joesph who orders her mammograms. Bilateral screening mammogram in September was negative. She has routine labs drawn at Dr. Margarete office. I will see her back in 6 months with bone density scan. If all is well, we can go to yearly visits, or see her annually in September if she wants me to take over ordering her mammograms.  REVIEW OF SYSTEMS:  Review of Systems   Constitutional: Negative.  Negative for appetite change, chills, fatigue, fever and unexpected weight change.  HENT:  Negative.  Negative for lump/mass, mouth sores and sore throat.   Eyes: Negative.   Respiratory: Negative.  Negative for cough and shortness of breath.   Cardiovascular: Negative.  Negative for chest pain and leg swelling.  Gastrointestinal: Negative.  Negative for abdominal pain, constipation, diarrhea, nausea and vomiting.  Endocrine: Negative.  Negative for hot flashes.  Genitourinary: Negative.  Negative for difficulty urinating, dysuria, frequency, hematuria, vaginal bleeding and vaginal discharge.   Musculoskeletal: Negative.  Negative for arthralgias, back pain, gait problem and myalgias.  Skin:  Negative for rash.  Neurological: Negative.  Negative for dizziness, extremity weakness, gait problem, headaches and light-headedness.  Hematological: Negative.  Negative for adenopathy. Does not bruise/bleed easily.  Psychiatric/Behavioral: Negative.  Negative for depression and sleep disturbance. The patient is not nervous/anxious.   All other systems reviewed and are negative.    VITALS:  Blood pressure (!) 143/64, pulse 64, temperature 97.8 F (36.6 C), temperature source Oral, resp. rate 16, height 5' (1.524 m), weight 155 lb 9.6 oz (70.6  kg), last menstrual period 07/31/2000, SpO2 98%.  Wt Readings from Last 3 Encounters:  01/17/24 155 lb 9.6 oz (70.6 kg)  07/11/23 156 lb 8 oz (71 kg)  01/11/23 159 lb 4.8 oz (72.3 kg)    Body mass index is 30.39 kg/m.  Performance status (ECOG): 0 - Asymptomatic  PHYSICAL EXAM:  Physical Exam Vitals and nursing note reviewed.  Constitutional:      General: She is not in acute distress.    Appearance: Normal appearance. She is normal weight.  HENT:     Head: Normocephalic and atraumatic.     Mouth/Throat:     Mouth: Mucous membranes are moist.     Pharynx: Oropharynx is clear. No oropharyngeal exudate or posterior  oropharyngeal erythema.   Eyes:     General: No scleral icterus.    Extraocular Movements: Extraocular movements intact.     Conjunctiva/sclera: Conjunctivae normal.     Pupils: Pupils are equal, round, and reactive to light.    Cardiovascular:     Rate and Rhythm: Normal rate and regular rhythm.     Heart sounds: Normal heart sounds. No murmur heard.    No friction rub. No gallop.  Pulmonary:     Effort: Pulmonary effort is normal.     Breath sounds: Normal breath sounds. No wheezing, rhonchi or rales.  Chest:  Breasts:    Right: Normal. No swelling, bleeding, inverted nipple, mass, nipple discharge, skin change or tenderness.     Left: Normal. No swelling, bleeding, inverted nipple, mass, nipple discharge, skin change or tenderness.     Comments: She has a well healed scar in the lower inner quadrant of the left breast and also in the left axilla Abdominal:     General: There is no distension.     Palpations: Abdomen is soft. There is no hepatomegaly, splenomegaly or mass.     Tenderness: There is no abdominal tenderness.   Musculoskeletal:        General: Normal range of motion.     Cervical back: Normal range of motion and neck supple. No tenderness.     Right lower leg: No edema.     Left lower leg: No edema.  Lymphadenopathy:     Cervical: No cervical adenopathy.     Upper Body:     Right upper body: No supraclavicular or axillary adenopathy.     Left upper body: No supraclavicular or axillary adenopathy.     Lower Body: No right inguinal adenopathy. No left inguinal adenopathy.   Skin:    General: Skin is warm and dry.     Coloration: Skin is not jaundiced.     Findings: No rash.   Neurological:     General: No focal deficit present.     Mental Status: She is alert and oriented to person, place, and time. Mental status is at baseline.     Cranial Nerves: No cranial nerve deficit.   Psychiatric:        Mood and Affect: Mood normal.        Behavior: Behavior  normal.        Thought Content: Thought content normal.        Judgment: Judgment normal.    LABS:       No data to display             No data to display          STUDIES:    Exam(s): [id number] MAM/MAM DIGITAL TOMO SCREENING B  CLINICAL DATA: Screening.  EXAM:  DIGITAL SCREENING BILATERAL MAMMOGRAM WITH TOMOSYNTHESIS AND CAD  TECHNIQUE:  Bilateral screening digital craniocaudal and mediolateral oblique  mammograms were obtained. Bilateral screening digital breast  tomosynthesis was performed. The images were evaluated with  computer-aided detection.  COMPARISON: Previous exam(s).  ACR Breast Density Category b: There are scattered areas of  fibroglandular density.  FINDINGS:  There are no findings suspicious for malignancy.  IMPRESSION:  No mammographic evidence of malignancy. A result letter of this  screening mammogram will be mailed directly to the patient.  RECOMMENDATION:  Screening mammogram in one year. (Code:SM-B-01Y)  BI-RADS CATEGORY 1: Negative.    HISTORY:   Past Medical History:  Diagnosis Date  . Allergy   . Arthritis   . Breast cancer (HCC)   . Hyperlipidemia   . Hypertension   . Melanoma of foot, left (HCC) 2008   treated with excision  . Osteopenia   . Skin cancer    History of basal cell carcinoma  . Vitamin D deficiency     Past Surgical History:  Procedure Laterality Date  . APPENDECTOMY    . BASAL CELL CARCINOMA EXCISION  2014   SCALP  . BREAST LUMPECTOMY WITH AXILLARY LYMPH NODE BIOPSY Left 05/17/2020  . MELANOMA EXCISION WITH SENTINEL LYMPH NODE BIOPSY Left 2008   FOOT  . TONSILLECTOMY  1954    Family History  Problem Relation Age of Onset  . Prostate cancer Father        in his 34s  . Pancreatic cancer Maternal Aunt        in her 56s    Social History:  reports that she quit smoking about 63 years ago. Her smoking use included cigarettes. She started smoking about 65 years ago. She has a 1 pack-year smoking  history. She has never used smokeless tobacco. She reports current alcohol use of about 1.0 standard drink of alcohol per week. She reports that she does not use drugs.The patient is alone today.  Allergies:  Allergies  Allergen Reactions  . Wound Dressing Adhesive   . Chlorhexidine Rash    Red raised rash with surrounding redness     Current Medications: Current Outpatient Medications  Medication Sig Dispense Refill  . alendronate (FOSAMAX) 70 MG tablet Take 70 mg by mouth once a week.    . anastrozole  (ARIMIDEX ) 1 MG tablet TAKE ONE TABLET BY MOUTH EVERY DAY 90 tablet 3  . aspirin EC 81 MG tablet Take 81 mg by mouth daily. Swallow whole.    . betamethasone valerate lotion (VALISONE) 0.1 % Apply topically.    . cholecalciferol (VITAMIN D3) 25 MCG (1000 UNIT) tablet Take 1,000 Units by mouth daily.    SABRA ezetimibe (ZETIA) 10 MG tablet Take 10 mg by mouth daily.    SABRA lisinopril-hydrochlorothiazide (ZESTORETIC) 20-12.5 MG tablet Take 1 tablet by mouth daily.    . metoprolol succinate (TOPROL-XL) 25 MG 24 hr tablet Take 25 mg by mouth daily.    . simvastatin (ZOCOR) 40 MG tablet Take 40 mg by mouth daily.     No current facility-administered medications for this visit.    I,Jasmine M Lassiter,acting as a scribe for Wanda VEAR Cornish, MD.,have documented all relevant documentation on the behalf of Wanda VEAR Cornish, MD,as directed by  Wanda VEAR Cornish, MD while in the presence of Wanda VEAR Cornish, MD.

## 2024-01-29 DIAGNOSIS — Z853 Personal history of malignant neoplasm of breast: Secondary | ICD-10-CM | POA: Diagnosis not present

## 2024-01-29 DIAGNOSIS — R197 Diarrhea, unspecified: Secondary | ICD-10-CM | POA: Diagnosis not present

## 2024-01-29 DIAGNOSIS — K828 Other specified diseases of gallbladder: Secondary | ICD-10-CM | POA: Diagnosis not present

## 2024-01-29 DIAGNOSIS — N179 Acute kidney failure, unspecified: Secondary | ICD-10-CM | POA: Diagnosis not present

## 2024-01-29 DIAGNOSIS — R1012 Left upper quadrant pain: Secondary | ICD-10-CM | POA: Diagnosis not present

## 2024-01-30 DIAGNOSIS — K828 Other specified diseases of gallbladder: Secondary | ICD-10-CM | POA: Diagnosis not present

## 2024-02-07 DIAGNOSIS — Z0189 Encounter for other specified special examinations: Secondary | ICD-10-CM | POA: Diagnosis not present

## 2024-02-07 DIAGNOSIS — Z7982 Long term (current) use of aspirin: Secondary | ICD-10-CM | POA: Diagnosis not present

## 2024-02-07 DIAGNOSIS — Z9889 Other specified postprocedural states: Secondary | ICD-10-CM | POA: Diagnosis not present

## 2024-02-07 DIAGNOSIS — E559 Vitamin D deficiency, unspecified: Secondary | ICD-10-CM | POA: Diagnosis not present

## 2024-02-07 DIAGNOSIS — Z9049 Acquired absence of other specified parts of digestive tract: Secondary | ICD-10-CM | POA: Diagnosis not present

## 2024-02-07 DIAGNOSIS — K8010 Calculus of gallbladder with chronic cholecystitis without obstruction: Secondary | ICD-10-CM | POA: Diagnosis not present

## 2024-02-07 DIAGNOSIS — K828 Other specified diseases of gallbladder: Secondary | ICD-10-CM | POA: Diagnosis not present

## 2024-02-07 DIAGNOSIS — G43909 Migraine, unspecified, not intractable, without status migrainosus: Secondary | ICD-10-CM | POA: Diagnosis not present

## 2024-02-07 DIAGNOSIS — I10 Essential (primary) hypertension: Secondary | ICD-10-CM | POA: Diagnosis not present

## 2024-02-07 DIAGNOSIS — M810 Age-related osteoporosis without current pathological fracture: Secondary | ICD-10-CM | POA: Diagnosis not present

## 2024-02-07 DIAGNOSIS — R0683 Snoring: Secondary | ICD-10-CM | POA: Diagnosis not present

## 2024-02-07 DIAGNOSIS — Z79899 Other long term (current) drug therapy: Secondary | ICD-10-CM | POA: Diagnosis not present

## 2024-02-07 DIAGNOSIS — Z853 Personal history of malignant neoplasm of breast: Secondary | ICD-10-CM | POA: Diagnosis not present

## 2024-02-07 DIAGNOSIS — E7800 Pure hypercholesterolemia, unspecified: Secondary | ICD-10-CM | POA: Diagnosis not present

## 2024-02-07 DIAGNOSIS — K219 Gastro-esophageal reflux disease without esophagitis: Secondary | ICD-10-CM | POA: Diagnosis not present

## 2024-02-07 DIAGNOSIS — M1712 Unilateral primary osteoarthritis, left knee: Secondary | ICD-10-CM | POA: Diagnosis not present

## 2024-02-07 DIAGNOSIS — M8580 Other specified disorders of bone density and structure, unspecified site: Secondary | ICD-10-CM | POA: Diagnosis not present

## 2024-02-07 DIAGNOSIS — Z85828 Personal history of other malignant neoplasm of skin: Secondary | ICD-10-CM | POA: Diagnosis not present

## 2024-02-14 DIAGNOSIS — Z7689 Persons encountering health services in other specified circumstances: Secondary | ICD-10-CM | POA: Diagnosis not present

## 2024-02-14 DIAGNOSIS — Z6828 Body mass index (BMI) 28.0-28.9, adult: Secondary | ICD-10-CM | POA: Diagnosis not present

## 2024-02-14 DIAGNOSIS — N179 Acute kidney failure, unspecified: Secondary | ICD-10-CM | POA: Diagnosis not present

## 2024-02-14 DIAGNOSIS — I10 Essential (primary) hypertension: Secondary | ICD-10-CM | POA: Diagnosis not present

## 2024-04-09 DIAGNOSIS — Z1231 Encounter for screening mammogram for malignant neoplasm of breast: Secondary | ICD-10-CM | POA: Diagnosis not present

## 2024-04-09 DIAGNOSIS — R92323 Mammographic fibroglandular density, bilateral breasts: Secondary | ICD-10-CM | POA: Diagnosis not present

## 2024-04-09 LAB — HM MAMMOGRAPHY

## 2024-04-16 DIAGNOSIS — C50312 Malignant neoplasm of lower-inner quadrant of left female breast: Secondary | ICD-10-CM | POA: Diagnosis not present

## 2024-05-07 DIAGNOSIS — Z853 Personal history of malignant neoplasm of breast: Secondary | ICD-10-CM | POA: Diagnosis not present

## 2024-05-07 DIAGNOSIS — I10 Essential (primary) hypertension: Secondary | ICD-10-CM | POA: Diagnosis not present

## 2024-05-07 DIAGNOSIS — M8580 Other specified disorders of bone density and structure, unspecified site: Secondary | ICD-10-CM | POA: Diagnosis not present

## 2024-05-07 DIAGNOSIS — Z23 Encounter for immunization: Secondary | ICD-10-CM | POA: Diagnosis not present

## 2024-05-07 DIAGNOSIS — Z6828 Body mass index (BMI) 28.0-28.9, adult: Secondary | ICD-10-CM | POA: Diagnosis not present

## 2024-05-07 DIAGNOSIS — E782 Mixed hyperlipidemia: Secondary | ICD-10-CM | POA: Diagnosis not present

## 2024-07-09 DIAGNOSIS — M8580 Other specified disorders of bone density and structure, unspecified site: Secondary | ICD-10-CM | POA: Diagnosis not present

## 2024-07-09 DIAGNOSIS — Z780 Asymptomatic menopausal state: Secondary | ICD-10-CM | POA: Diagnosis not present

## 2024-07-09 LAB — HM DEXA SCAN

## 2024-07-15 ENCOUNTER — Telehealth: Payer: Self-pay

## 2024-07-15 ENCOUNTER — Inpatient Hospital Stay: Admitting: Hematology and Oncology

## 2024-07-15 ENCOUNTER — Encounter: Payer: Self-pay | Admitting: Hematology and Oncology

## 2024-07-15 ENCOUNTER — Other Ambulatory Visit: Payer: Self-pay

## 2024-07-15 VITALS — BP 186/66 | HR 71 | Temp 98.1°F | Resp 18 | Ht 60.0 in | Wt 152.6 lb

## 2024-07-15 DIAGNOSIS — C50212 Malignant neoplasm of upper-inner quadrant of left female breast: Secondary | ICD-10-CM

## 2024-07-15 DIAGNOSIS — M8580 Other specified disorders of bone density and structure, unspecified site: Secondary | ICD-10-CM

## 2024-07-15 NOTE — Assessment & Plan Note (Signed)
 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  She was treated with lumpectomy and placed on adjuvant hormonal therapy with anastrozole  1 mg daily in December 2021.  Most recent bilateral screening mammogram in September did not reveal any evidence of malignancy. She remains without evidence of recurrence.  She knows to continue anastrozole  daily.  We will plan to see her back in 6 months for repeat clinical assessment.

## 2024-07-15 NOTE — Assessment & Plan Note (Addendum)
 She remains on alendronate weekly, in addition to calcium/vitamin D daily.  Repeat bone density scan on December 10 reveals improved to stable osteopenia.  As she is on anastrozole  daily, I recommend she continue alendronate weekly in addition to calcium/vitamin D daily.  She will be due for repeat bone density scan in December 2027.

## 2024-07-15 NOTE — Progress Notes (Signed)
 " [facility]  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  She was treated with lumpectomy and placed on adjuvant hormonal therapy with anastrozole  1 mg daily in December 2021.  Most recent bilateral screening mammogram in September did not reveal any evidence of malignancy. She remains without evidence of recurrence.  She knows to continue anastrozole  daily.  Due to her personal and family history of malignancy, I recommend she see our genetic counselor for consideration of testing for hereditary cancer syndromes..  We will plan to see her back in 6 months for repeat examination.  Osteopenia after menopause She remains on alendronate weekly, in addition to calcium/vitamin D daily.  Repeat bone density scan on December 10 reveals improved to stable osteopenia.  As she is on anastrozole  daily, I recommend she continue alendronate weekly in addition to calcium/vitamin D daily.  She will be due for repeat bone density scan in December 2027.  Melanoma of skin (HCC) Remote history of melanoma of the left foot treated with wide excision and sentinel lymph node biopsy in March 2008.  She continues to follow with dermatology.      The patient understands the plans discussed today and is in agreement with them.  She knows to contact our office if she develops concerns prior to her next appointment.   I provided 25 minutes of face-to-face time during this encounter and > 50% was spent counseling as documented under my assessment and plan.    Shakia Sebastiano A Aleda Madl, PA-C   [facility] [place] - [facility] OF [facility]. [facility] [address] [place] [address] Dept: [phone] Dept Fax: [phone]   No orders of the defined types were placed in this encounter.     CHIEF COMPLAINT:  CC: Stage IA hormone receptor positive left breast cancer  Current Treatment: Anastrozole  1 mg daily  HISTORY OF PRESENT ILLNESS:   Oncology History  Malignant neoplasm of upper-inner quadrant of left breast in female, estrogen receptor positive (HCC)  05/10/2020 Initial Diagnosis   Breast cancer, left (HCC)   05/17/2020 Cancer Staging   Staging form: Breast, AJCC 8th Edition - Clinical stage from 05/17/2020: Stage IA (cT1a, cN0(sn), cM0, G1, ER+, PR+, HER2-) - Signed by Cornelius Wanda VEAR, MD on 06/13/2020   Melanoma of skin (HCC)  10/12/2006 Initial Diagnosis   Melanoma of skin (HCC)       INTERVAL HISTORY:   Ashley Larson is here today for repeat clinical assessment.  She states she continues anastrozole  daily without significant difficulty.  She also continues alendronate weekly, as well as calcium/vitamin D daily without difficulty.  She denies any changes in her breasts.  She denies fevers or chills. She reports left knee pain attributed to arthritis. Her appetite is good. Her weight has decreased 3 pounds over last 6 months.  She continues to follow up with Dr. Joesph. She had a cholecystectomy in July. Bilateral screening mammogram in September 2025 did not reveal any evidence of malignancy.  She states she had COVID in September and was treated with Paxlovid.  Bone density scan December 10 revealed osteopenia with a T-score of -1.9 in  the spine, previously -2.3, a T-score of -1.8 in the left femur neck previously -1.9, T-score of -0.9 in the left total hip, T-score of -1.9 in the right femur neck and T-score -1.4 in the right total hip.  She has routine labs at Dr. Fanny office.  She also has history of melanoma and is to follow-up with dermatology tomorrow.  She will see if they can repeat her blood  pressure.  Review of her family history reveals a maternal aunt had pancreatic cancer in her 51's and her father had prostate cancer in his 80's.  She has never had genetic counseling or testing.  REVIEW OF SYSTEMS:   Review of Systems  Constitutional:  Negative for appetite change, chills, fatigue, fever and unexpected weight change.  HENT:   Negative for lump/mass, mouth sores and sore throat.   Respiratory:  Negative for cough and shortness of breath.   Cardiovascular:  Negative for chest pain and leg swelling.  Gastrointestinal:  Negative for abdominal pain, constipation, diarrhea, nausea and vomiting.  Endocrine: Negative for hot flashes.  Genitourinary:  Negative for difficulty urinating, dysuria, frequency and hematuria.   Musculoskeletal:  Positive for arthralgias (left knee). Negative for back pain and myalgias.  Skin:  Negative for rash.  Neurological:  Negative for dizziness and headaches.  Hematological:  Negative for adenopathy. Does not bruise/bleed easily.  Psychiatric/Behavioral:  Negative for depression and sleep disturbance. The patient is not nervous/anxious.      VITALS:   Blood pressure (!) 186/66, pulse 71, temperature 98.1 F (36.7 C), temperature source Oral, resp. rate 18, height 5' (1.524 m), weight 152 lb 9.6 oz (69.2 kg), last menstrual period 07/31/2000, SpO2 100%.  Wt Readings from Last 3 Encounters:  07/15/24 152 lb 9.6 oz (69.2 kg)  01/17/24 155 lb 9.6 oz (70.6 kg)  07/11/23 156 lb 8 oz (71 kg)    Body mass index is 29.8 kg/m.  Performance status (ECOG): 0 - Asymptomatic    PHYSICAL EXAM:   Physical Exam Vitals and nursing note reviewed.  Constitutional:      General: She is not in acute distress.    Appearance: Normal appearance. She is not ill-appearing.  HENT:     Head: Normocephalic and atraumatic.     Mouth/Throat:     Mouth: Mucous membranes are moist.     Pharynx: Oropharynx is clear. No oropharyngeal exudate or posterior  oropharyngeal erythema.  Eyes:     General: No scleral icterus.    Extraocular Movements: Extraocular movements intact.     Conjunctiva/sclera: Conjunctivae normal.     Pupils: Pupils are equal, round, and reactive to light.  Cardiovascular:     Rate and Rhythm: Normal rate and regular rhythm.     Heart sounds: Normal heart sounds. No murmur heard.    No friction rub. No gallop.  Pulmonary:     Effort: Pulmonary effort is normal.     Breath sounds: Normal breath sounds. No wheezing, rhonchi or rales.  Chest:  Breasts:    Right: Normal. No swelling, bleeding, inverted nipple, mass, nipple discharge, skin change or tenderness.     Left: Normal. No swelling, bleeding, inverted nipple, mass, nipple discharge, skin change or tenderness.  Abdominal:     General: There is no distension.     Palpations: Abdomen is soft. There is no hepatomegaly, splenomegaly or mass.     Tenderness: There is no abdominal tenderness.  Musculoskeletal:        General: Normal range of motion.  Cervical back: Normal range of motion and neck supple. No tenderness.     Right lower leg: No edema.     Left lower leg: No edema.  Lymphadenopathy:     Cervical: No cervical adenopathy.     Upper Body:     Right upper body: No supraclavicular or axillary adenopathy.     Left upper body: No supraclavicular or axillary adenopathy.     Lower Body: No right inguinal adenopathy. No left inguinal adenopathy.  Skin:    General: Skin is warm and dry.     Coloration: Skin is not jaundiced.     Findings: No rash.  Neurological:     Mental Status: She is alert and oriented to person, place, and time.     Cranial Nerves: No cranial nerve deficit.  Psychiatric:        Mood and Affect: Mood normal.        Behavior: Behavior normal.        Thought Content: Thought content normal.     LABS:       No data to display           STUDIES:   No results found.    HISTORY:   Past Medical History:  Diagnosis  Date   Allergy    Arthritis    Breast cancer (HCC)    Hyperlipidemia    Hypertension    Melanoma of foot, left (HCC) 2008   treated with excision   Osteopenia    Skin cancer    History of basal cell carcinoma   Vitamin D deficiency     Past Surgical History:  Procedure Laterality Date   APPENDECTOMY     BASAL CELL CARCINOMA EXCISION  2014   SCALP   BREAST LUMPECTOMY WITH AXILLARY LYMPH NODE BIOPSY Left 05/17/2020   MELANOMA EXCISION WITH SENTINEL LYMPH NODE BIOPSY Left 2008   FOOT   TONSILLECTOMY  1954    Family History  Problem Relation Age of Onset   Prostate cancer Father        in his 4s   Pancreatic cancer Maternal Aunt        in her 75s    Social History:  reports that she quit smoking about 64 years ago. Her smoking use included cigarettes. She started smoking about 66 years ago. She has a 1 pack-year smoking history. She has never used smokeless tobacco. She reports current alcohol use of about 1.0 standard drink of alcohol per week. She reports that she does not use drugs.The patient is alone today.  Allergies: Allergies[1]  Current Medications: Current Outpatient Medications  Medication Sig Dispense Refill   fluticasone (FLONASE) 50 MCG/ACT nasal spray Place 1 spray into both nostrils daily.     lisinopril (ZESTRIL) 20 MG tablet Take 20 mg by mouth daily.     alendronate (FOSAMAX) 70 MG tablet Take 70 mg by mouth once a week.     anastrozole  (ARIMIDEX ) 1 MG tablet TAKE ONE TABLET BY MOUTH EVERY DAY 90 tablet 3   aspirin EC 81 MG tablet Take 81 mg by mouth daily. Swallow whole.     betamethasone valerate lotion (VALISONE) 0.1 % Apply topically.     cholecalciferol (VITAMIN D3) 25 MCG (1000 UNIT) tablet Take 1,000 Units by mouth daily.     ezetimibe (ZETIA) 10 MG tablet Take 10 mg by mouth daily.     metoprolol succinate (TOPROL-XL) 25 MG 24 hr tablet Take 25 mg by mouth daily.  simvastatin (ZOCOR) 40 MG tablet Take 40 mg by mouth daily.     No current  facility-administered medications for this visit.              [1]  Allergies Allergen Reactions   Wound Dressing Adhesive    Chlorhexidine Rash    Red raised rash with surrounding redness    "

## 2024-07-15 NOTE — Telephone Encounter (Signed)
 Pt LVM on nurse line returning [facility] call. She states that she is interested in the genetic counseling, but would appreciate if it could be done after Aug 03, 2024.

## 2024-07-16 NOTE — Assessment & Plan Note (Signed)
 Remote history of melanoma of the left foot treated with wide excision and sentinel lymph node biopsy in March 2008.  She continues to follow with dermatology.

## 2024-08-04 ENCOUNTER — Encounter: Payer: Self-pay | Admitting: Oncology

## 2024-09-02 ENCOUNTER — Telehealth: Payer: Self-pay | Admitting: Hematology and Oncology

## 2024-09-02 NOTE — Telephone Encounter (Signed)
 Good Morning, courteous notification of patient calling in today to cancel Genetics Referral for 09/03/2024. She has a cold and not feeling well. Offered mychart or telephone visit for consultation. She declined and says she may not feel like talking even over the phone and will call back to reschedule. Thank you

## 2024-09-03 ENCOUNTER — Inpatient Hospital Stay: Admitting: Genetic Counselor

## 2024-09-03 ENCOUNTER — Inpatient Hospital Stay

## 2024-10-13 ENCOUNTER — Inpatient Hospital Stay: Attending: Hematology and Oncology | Admitting: Genetic Counselor

## 2025-01-13 ENCOUNTER — Inpatient Hospital Stay

## 2025-01-13 ENCOUNTER — Inpatient Hospital Stay: Admitting: Oncology
